# Patient Record
Sex: Female | Born: 1935
Health system: Southern US, Community
[De-identification: ages and names within clinical notes are randomized; demographics above are authoritative.]

## PROBLEM LIST (undated history)

## (undated) DIAGNOSIS — M1990 Unspecified osteoarthritis, unspecified site: Secondary | ICD-10-CM

## (undated) DIAGNOSIS — I10 Essential (primary) hypertension: Secondary | ICD-10-CM

## (undated) DIAGNOSIS — E871 Hypo-osmolality and hyponatremia: Secondary | ICD-10-CM

## (undated) DIAGNOSIS — I447 Left bundle-branch block, unspecified: Secondary | ICD-10-CM

## (undated) DIAGNOSIS — F419 Anxiety disorder, unspecified: Secondary | ICD-10-CM

## (undated) DIAGNOSIS — M810 Age-related osteoporosis without current pathological fracture: Secondary | ICD-10-CM

## (undated) DIAGNOSIS — K219 Gastro-esophageal reflux disease without esophagitis: Secondary | ICD-10-CM

## (undated) DIAGNOSIS — E7800 Pure hypercholesterolemia, unspecified: Secondary | ICD-10-CM

## (undated) DIAGNOSIS — Z01810 Encounter for preprocedural cardiovascular examination: Secondary | ICD-10-CM

## (undated) DIAGNOSIS — I4891 Unspecified atrial fibrillation: Secondary | ICD-10-CM

## (undated) DIAGNOSIS — F329 Major depressive disorder, single episode, unspecified: Secondary | ICD-10-CM

## (undated) DIAGNOSIS — Z9981 Dependence on supplemental oxygen: Secondary | ICD-10-CM

## (undated) DIAGNOSIS — H409 Unspecified glaucoma: Secondary | ICD-10-CM

## (undated) DIAGNOSIS — I119 Hypertensive heart disease without heart failure: Secondary | ICD-10-CM

## (undated) DIAGNOSIS — S72001A Fracture of unspecified part of neck of right femur, initial encounter for closed fracture: Secondary | ICD-10-CM

## (undated) DIAGNOSIS — I509 Heart failure, unspecified: Secondary | ICD-10-CM

## (undated) DIAGNOSIS — D62 Acute posthemorrhagic anemia: Secondary | ICD-10-CM

## (undated) DIAGNOSIS — S72009A Fracture of unspecified part of neck of unspecified femur, initial encounter for closed fracture: Secondary | ICD-10-CM

## (undated) DIAGNOSIS — R509 Fever, unspecified: Secondary | ICD-10-CM

## (undated) HISTORY — DX: Fever, unspecified: R50.9

## (undated) HISTORY — PX: TUMOR REMOVAL: SHX12

## (undated) HISTORY — DX: Hypo-osmolality and hyponatremia: E87.1

## (undated) HISTORY — DX: Hypertensive heart disease without heart failure: I11.9

## (undated) HISTORY — DX: Unspecified atrial fibrillation: I48.91

## (undated) HISTORY — PX: HERNIA REPAIR: SHX51

## (undated) HISTORY — DX: Unspecified osteoarthritis, unspecified site: M19.90

## (undated) HISTORY — DX: Encounter for preprocedural cardiovascular examination: Z01.810

## (undated) HISTORY — DX: Left bundle-branch block, unspecified: I44.7

## (undated) HISTORY — DX: Age-related osteoporosis without current pathological fracture: M81.0

## (undated) HISTORY — DX: Anxiety disorder, unspecified: F41.9

## (undated) HISTORY — DX: Gastro-esophageal reflux disease without esophagitis: K21.9

## (undated) HISTORY — DX: Acute posthemorrhagic anemia: D62

## (undated) HISTORY — DX: Major depressive disorder, single episode, unspecified: F32.9

---

## 2011-12-31 DIAGNOSIS — R0602 Shortness of breath: Secondary | ICD-10-CM | POA: Diagnosis not present

## 2011-12-31 DIAGNOSIS — L570 Actinic keratosis: Secondary | ICD-10-CM | POA: Diagnosis not present

## 2011-12-31 DIAGNOSIS — Z79899 Other long term (current) drug therapy: Secondary | ICD-10-CM | POA: Diagnosis not present

## 2011-12-31 DIAGNOSIS — R5381 Other malaise: Secondary | ICD-10-CM | POA: Diagnosis not present

## 2011-12-31 DIAGNOSIS — I10 Essential (primary) hypertension: Secondary | ICD-10-CM | POA: Diagnosis not present

## 2011-12-31 DIAGNOSIS — E782 Mixed hyperlipidemia: Secondary | ICD-10-CM | POA: Diagnosis not present

## 2012-01-19 DIAGNOSIS — L570 Actinic keratosis: Secondary | ICD-10-CM | POA: Diagnosis not present

## 2012-03-03 DIAGNOSIS — L570 Actinic keratosis: Secondary | ICD-10-CM | POA: Diagnosis not present

## 2012-05-25 DIAGNOSIS — F329 Major depressive disorder, single episode, unspecified: Secondary | ICD-10-CM | POA: Diagnosis not present

## 2012-05-25 DIAGNOSIS — I10 Essential (primary) hypertension: Secondary | ICD-10-CM | POA: Diagnosis not present

## 2012-05-25 DIAGNOSIS — Z79899 Other long term (current) drug therapy: Secondary | ICD-10-CM | POA: Diagnosis not present

## 2012-05-25 DIAGNOSIS — E782 Mixed hyperlipidemia: Secondary | ICD-10-CM | POA: Diagnosis not present

## 2012-06-27 DIAGNOSIS — H2510 Age-related nuclear cataract, unspecified eye: Secondary | ICD-10-CM | POA: Diagnosis not present

## 2012-07-05 DIAGNOSIS — R0989 Other specified symptoms and signs involving the circulatory and respiratory systems: Secondary | ICD-10-CM | POA: Diagnosis not present

## 2012-07-05 DIAGNOSIS — I10 Essential (primary) hypertension: Secondary | ICD-10-CM | POA: Diagnosis not present

## 2012-07-05 DIAGNOSIS — H409 Unspecified glaucoma: Secondary | ICD-10-CM | POA: Diagnosis not present

## 2012-07-05 DIAGNOSIS — E782 Mixed hyperlipidemia: Secondary | ICD-10-CM | POA: Diagnosis not present

## 2012-07-18 DIAGNOSIS — H2510 Age-related nuclear cataract, unspecified eye: Secondary | ICD-10-CM | POA: Diagnosis not present

## 2012-07-18 DIAGNOSIS — H40019 Open angle with borderline findings, low risk, unspecified eye: Secondary | ICD-10-CM | POA: Diagnosis not present

## 2012-08-08 DIAGNOSIS — H2510 Age-related nuclear cataract, unspecified eye: Secondary | ICD-10-CM | POA: Diagnosis not present

## 2012-08-08 DIAGNOSIS — H259 Unspecified age-related cataract: Secondary | ICD-10-CM | POA: Diagnosis not present

## 2012-08-08 DIAGNOSIS — H269 Unspecified cataract: Secondary | ICD-10-CM | POA: Diagnosis not present

## 2012-08-17 DIAGNOSIS — G4700 Insomnia, unspecified: Secondary | ICD-10-CM | POA: Diagnosis not present

## 2012-08-17 DIAGNOSIS — M25559 Pain in unspecified hip: Secondary | ICD-10-CM | POA: Diagnosis not present

## 2012-08-17 DIAGNOSIS — F411 Generalized anxiety disorder: Secondary | ICD-10-CM | POA: Diagnosis not present

## 2012-10-03 DIAGNOSIS — H26499 Other secondary cataract, unspecified eye: Secondary | ICD-10-CM | POA: Diagnosis not present

## 2012-11-13 DIAGNOSIS — H9209 Otalgia, unspecified ear: Secondary | ICD-10-CM | POA: Diagnosis not present

## 2012-11-16 DIAGNOSIS — Z113 Encounter for screening for infections with a predominantly sexual mode of transmission: Secondary | ICD-10-CM | POA: Diagnosis not present

## 2012-11-16 DIAGNOSIS — Z1339 Encounter for screening examination for other mental health and behavioral disorders: Secondary | ICD-10-CM | POA: Diagnosis not present

## 2012-11-16 DIAGNOSIS — Z1331 Encounter for screening for depression: Secondary | ICD-10-CM | POA: Diagnosis not present

## 2012-11-16 DIAGNOSIS — R5381 Other malaise: Secondary | ICD-10-CM | POA: Diagnosis not present

## 2012-11-16 DIAGNOSIS — E782 Mixed hyperlipidemia: Secondary | ICD-10-CM | POA: Diagnosis not present

## 2012-11-16 DIAGNOSIS — I10 Essential (primary) hypertension: Secondary | ICD-10-CM | POA: Diagnosis not present

## 2012-11-16 DIAGNOSIS — Z79899 Other long term (current) drug therapy: Secondary | ICD-10-CM | POA: Diagnosis not present

## 2012-11-16 DIAGNOSIS — Z0000 Encounter for general adult medical examination without abnormal findings: Secondary | ICD-10-CM | POA: Diagnosis not present

## 2013-02-16 DIAGNOSIS — Z79899 Other long term (current) drug therapy: Secondary | ICD-10-CM | POA: Diagnosis not present

## 2013-02-16 DIAGNOSIS — J309 Allergic rhinitis, unspecified: Secondary | ICD-10-CM | POA: Diagnosis not present

## 2013-02-16 DIAGNOSIS — E782 Mixed hyperlipidemia: Secondary | ICD-10-CM | POA: Diagnosis not present

## 2013-02-16 DIAGNOSIS — H905 Unspecified sensorineural hearing loss: Secondary | ICD-10-CM | POA: Diagnosis not present

## 2013-02-16 DIAGNOSIS — I10 Essential (primary) hypertension: Secondary | ICD-10-CM | POA: Diagnosis not present

## 2013-04-02 DIAGNOSIS — H02059 Trichiasis without entropian unspecified eye, unspecified eyelid: Secondary | ICD-10-CM | POA: Diagnosis not present

## 2013-04-02 DIAGNOSIS — H4011X0 Primary open-angle glaucoma, stage unspecified: Secondary | ICD-10-CM | POA: Diagnosis not present

## 2013-04-11 DIAGNOSIS — S72033A Displaced midcervical fracture of unspecified femur, initial encounter for closed fracture: Secondary | ICD-10-CM | POA: Diagnosis not present

## 2013-04-11 DIAGNOSIS — I10 Essential (primary) hypertension: Secondary | ICD-10-CM | POA: Diagnosis not present

## 2013-04-11 DIAGNOSIS — E7800 Pure hypercholesterolemia, unspecified: Secondary | ICD-10-CM | POA: Diagnosis not present

## 2013-04-11 DIAGNOSIS — W010XXA Fall on same level from slipping, tripping and stumbling without subsequent striking against object, initial encounter: Secondary | ICD-10-CM | POA: Diagnosis not present

## 2013-04-11 DIAGNOSIS — Z01818 Encounter for other preprocedural examination: Secondary | ICD-10-CM | POA: Diagnosis not present

## 2013-04-11 DIAGNOSIS — I498 Other specified cardiac arrhythmias: Secondary | ICD-10-CM | POA: Diagnosis not present

## 2013-04-11 DIAGNOSIS — R0989 Other specified symptoms and signs involving the circulatory and respiratory systems: Secondary | ICD-10-CM | POA: Diagnosis not present

## 2013-04-11 DIAGNOSIS — S72009A Fracture of unspecified part of neck of unspecified femur, initial encounter for closed fracture: Secondary | ICD-10-CM | POA: Diagnosis not present

## 2013-04-12 ENCOUNTER — Inpatient Hospital Stay (HOSPITAL_COMMUNITY): Payer: Medicare Other

## 2013-04-12 ENCOUNTER — Inpatient Hospital Stay (HOSPITAL_COMMUNITY)
Admission: EM | Admit: 2013-04-12 | Discharge: 2013-04-17 | DRG: 470 | Disposition: A | Discharge status: Skilled Nursing Facility | Payer: Medicare Other | Attending: Internal Medicine | Admitting: Internal Medicine

## 2013-04-12 ENCOUNTER — Encounter (HOSPITAL_COMMUNITY)
Admission: EM | Disposition: A | Discharge status: Skilled Nursing Facility | Payer: Self-pay | Source: Home / Self Care | Attending: Internal Medicine

## 2013-04-12 ENCOUNTER — Emergency Department (HOSPITAL_COMMUNITY): Payer: Medicare Other

## 2013-04-12 ENCOUNTER — Encounter (HOSPITAL_COMMUNITY): Payer: Self-pay | Admitting: Emergency Medicine

## 2013-04-12 DIAGNOSIS — F329 Major depressive disorder, single episode, unspecified: Secondary | ICD-10-CM | POA: Diagnosis present

## 2013-04-12 DIAGNOSIS — Z471 Aftercare following joint replacement surgery: Secondary | ICD-10-CM | POA: Diagnosis not present

## 2013-04-12 DIAGNOSIS — Z881 Allergy status to other antibiotic agents status: Secondary | ICD-10-CM

## 2013-04-12 DIAGNOSIS — E785 Hyperlipidemia, unspecified: Secondary | ICD-10-CM | POA: Diagnosis not present

## 2013-04-12 DIAGNOSIS — I498 Other specified cardiac arrhythmias: Secondary | ICD-10-CM | POA: Diagnosis not present

## 2013-04-12 DIAGNOSIS — Z882 Allergy status to sulfonamides status: Secondary | ICD-10-CM

## 2013-04-12 DIAGNOSIS — Z01818 Encounter for other preprocedural examination: Secondary | ICD-10-CM | POA: Diagnosis not present

## 2013-04-12 DIAGNOSIS — S72009A Fracture of unspecified part of neck of unspecified femur, initial encounter for closed fracture: Secondary | ICD-10-CM

## 2013-04-12 DIAGNOSIS — Z96649 Presence of unspecified artificial hip joint: Secondary | ICD-10-CM | POA: Diagnosis not present

## 2013-04-12 DIAGNOSIS — K219 Gastro-esophageal reflux disease without esophagitis: Secondary | ICD-10-CM | POA: Diagnosis not present

## 2013-04-12 DIAGNOSIS — Z602 Problems related to living alone: Secondary | ICD-10-CM

## 2013-04-12 DIAGNOSIS — Z7901 Long term (current) use of anticoagulants: Secondary | ICD-10-CM

## 2013-04-12 DIAGNOSIS — R0989 Other specified symptoms and signs involving the circulatory and respiratory systems: Secondary | ICD-10-CM | POA: Diagnosis not present

## 2013-04-12 DIAGNOSIS — I119 Hypertensive heart disease without heart failure: Secondary | ICD-10-CM | POA: Diagnosis not present

## 2013-04-12 DIAGNOSIS — Z66 Do not resuscitate: Secondary | ICD-10-CM | POA: Diagnosis present

## 2013-04-12 DIAGNOSIS — H409 Unspecified glaucoma: Secondary | ICD-10-CM | POA: Diagnosis present

## 2013-04-12 DIAGNOSIS — S73006A Unspecified dislocation of unspecified hip, initial encounter: Secondary | ICD-10-CM | POA: Diagnosis not present

## 2013-04-12 DIAGNOSIS — Z7982 Long term (current) use of aspirin: Secondary | ICD-10-CM | POA: Diagnosis not present

## 2013-04-12 DIAGNOSIS — F411 Generalized anxiety disorder: Secondary | ICD-10-CM | POA: Diagnosis present

## 2013-04-12 DIAGNOSIS — I10 Essential (primary) hypertension: Secondary | ICD-10-CM | POA: Diagnosis present

## 2013-04-12 DIAGNOSIS — D62 Acute posthemorrhagic anemia: Secondary | ICD-10-CM | POA: Diagnosis present

## 2013-04-12 DIAGNOSIS — E871 Hypo-osmolality and hyponatremia: Secondary | ICD-10-CM | POA: Diagnosis not present

## 2013-04-12 DIAGNOSIS — W010XXA Fall on same level from slipping, tripping and stumbling without subsequent striking against object, initial encounter: Secondary | ICD-10-CM | POA: Diagnosis present

## 2013-04-12 DIAGNOSIS — F3289 Other specified depressive episodes: Secondary | ICD-10-CM | POA: Diagnosis present

## 2013-04-12 DIAGNOSIS — S72009D Fracture of unspecified part of neck of unspecified femur, subsequent encounter for closed fracture with routine healing: Secondary | ICD-10-CM | POA: Diagnosis not present

## 2013-04-12 DIAGNOSIS — R509 Fever, unspecified: Secondary | ICD-10-CM | POA: Diagnosis present

## 2013-04-12 DIAGNOSIS — Z01810 Encounter for preprocedural cardiovascular examination: Secondary | ICD-10-CM

## 2013-04-12 DIAGNOSIS — S7290XA Unspecified fracture of unspecified femur, initial encounter for closed fracture: Secondary | ICD-10-CM | POA: Diagnosis not present

## 2013-04-12 DIAGNOSIS — E7800 Pure hypercholesterolemia, unspecified: Secondary | ICD-10-CM

## 2013-04-12 DIAGNOSIS — Z79899 Other long term (current) drug therapy: Secondary | ICD-10-CM

## 2013-04-12 DIAGNOSIS — S72001A Fracture of unspecified part of neck of right femur, initial encounter for closed fracture: Secondary | ICD-10-CM

## 2013-04-12 DIAGNOSIS — Y92009 Unspecified place in unspecified non-institutional (private) residence as the place of occurrence of the external cause: Secondary | ICD-10-CM

## 2013-04-12 DIAGNOSIS — M25559 Pain in unspecified hip: Secondary | ICD-10-CM | POA: Diagnosis not present

## 2013-04-12 DIAGNOSIS — S72033A Displaced midcervical fracture of unspecified femur, initial encounter for closed fracture: Principal | ICD-10-CM | POA: Diagnosis present

## 2013-04-12 DIAGNOSIS — D500 Iron deficiency anemia secondary to blood loss (chronic): Secondary | ICD-10-CM | POA: Diagnosis not present

## 2013-04-12 DIAGNOSIS — M810 Age-related osteoporosis without current pathological fracture: Secondary | ICD-10-CM | POA: Diagnosis present

## 2013-04-12 DIAGNOSIS — Z87891 Personal history of nicotine dependence: Secondary | ICD-10-CM | POA: Diagnosis not present

## 2013-04-12 DIAGNOSIS — J9819 Other pulmonary collapse: Secondary | ICD-10-CM | POA: Diagnosis not present

## 2013-04-12 DIAGNOSIS — J90 Pleural effusion, not elsewhere classified: Secondary | ICD-10-CM | POA: Diagnosis not present

## 2013-04-12 HISTORY — DX: Encounter for preprocedural cardiovascular examination: Z01.810

## 2013-04-12 HISTORY — DX: Pure hypercholesterolemia, unspecified: E78.00

## 2013-04-12 HISTORY — DX: Fracture of unspecified part of neck of right femur, initial encounter for closed fracture: S72.001A

## 2013-04-12 HISTORY — DX: Unspecified glaucoma: H40.9

## 2013-04-12 HISTORY — DX: Gastro-esophageal reflux disease without esophagitis: K21.9

## 2013-04-12 HISTORY — DX: Fracture of unspecified part of neck of unspecified femur, initial encounter for closed fracture: S72.009A

## 2013-04-12 HISTORY — DX: Age-related osteoporosis without current pathological fracture: M81.0

## 2013-04-12 HISTORY — DX: Essential (primary) hypertension: I10

## 2013-04-12 LAB — URINALYSIS, ROUTINE W REFLEX MICROSCOPIC
Leukocytes, UA: NEGATIVE
Nitrite: NEGATIVE
Protein, ur: NEGATIVE mg/dL
Urobilinogen, UA: 1 mg/dL (ref 0.0–1.0)

## 2013-04-12 LAB — CBC
MCHC: 35.1 g/dL (ref 30.0–36.0)
RDW: 13.1 % (ref 11.5–15.5)

## 2013-04-12 LAB — COMPREHENSIVE METABOLIC PANEL
ALT: 26 U/L (ref 0–35)
AST: 33 U/L (ref 0–37)
Calcium: 10.4 mg/dL (ref 8.4–10.5)
GFR calc Af Amer: >90 mL/min (ref >90)
Sodium: 138 mEq/L (ref 135–145)
Total Protein: 7.9 g/dL (ref 6.0–8.3)

## 2013-04-12 LAB — APTT: aPTT: 29 seconds (ref 24–37)

## 2013-04-12 SURGERY — HEMIARTHROPLASTY, HIP, DIRECT ANTERIOR APPROACH, FOR FRACTURE
Anesthesia: General | Laterality: Right

## 2013-04-12 MED ORDER — FENTANYL CITRATE 0.05 MG/ML IJ SOLN
50.0000 ug | Freq: Once | INTRAMUSCULAR | Status: AC
  Administered 2013-04-12: 50 ug via INTRAVENOUS
  Filled 2013-04-12: qty 2

## 2013-04-12 MED ORDER — SODIUM CHLORIDE 0.9 % IV BOLUS (SEPSIS)
1000.0000 mL | Freq: Once | INTRAVENOUS | Status: AC
  Administered 2013-04-12: 1000 mL via INTRAVENOUS

## 2013-04-12 MED ORDER — METOPROLOL TARTRATE 1 MG/ML IV SOLN: 5.0000 mg | INTRAVENOUS | Status: DC | PRN

## 2013-04-12 MED ORDER — MORPHINE SULFATE 2 MG/ML IJ SOLN: 0.5000 mg | INTRAMUSCULAR | Status: DC | PRN

## 2013-04-12 MED ORDER — HYDROCODONE-ACETAMINOPHEN 5-325 MG PO TABS
1.0000 | ORAL_TABLET | Freq: Four times a day (QID) | ORAL | Status: DC | PRN
  Administered 2013-04-12 – 2013-04-13 (×4): 1 via ORAL
  Filled 2013-04-12 (×3): qty 1
  Filled 2013-04-12: qty 2

## 2013-04-12 MED ORDER — SORBITOL 70 % SOLN
30.0000 mL | Freq: Every day | Status: DC | PRN
  Filled 2013-04-12: qty 30

## 2013-04-12 MED ORDER — SERTRALINE HCL 25 MG PO TABS
25.0000 mg | ORAL_TABLET | Freq: Every day | ORAL | Status: DC
  Administered 2013-04-12 – 2013-04-17 (×6): 25 mg via ORAL
  Filled 2013-04-12 (×6): qty 1

## 2013-04-12 MED ORDER — AMLODIPINE BESYLATE 5 MG PO TABS
5.0000 mg | ORAL_TABLET | Freq: Every day | ORAL | Status: DC
  Administered 2013-04-12 – 2013-04-17 (×6): 5 mg via ORAL
  Filled 2013-04-12 (×6): qty 1

## 2013-04-12 MED ORDER — HYDROCHLOROTHIAZIDE 25 MG PO TABS
25.0000 mg | ORAL_TABLET | Freq: Every day | ORAL | Status: DC
  Administered 2013-04-12 – 2013-04-17 (×6): 25 mg via ORAL
  Filled 2013-04-12 (×6): qty 1

## 2013-04-12 MED ORDER — FAMOTIDINE 10 MG PO TABS
10.0000 mg | ORAL_TABLET | Freq: Every day | ORAL | Status: DC
  Administered 2013-04-12 – 2013-04-16 (×5): 10 mg via ORAL
  Filled 2013-04-12 (×7): qty 1

## 2013-04-12 MED ORDER — LORAZEPAM 0.5 MG PO TABS
0.5000 mg | ORAL_TABLET | Freq: Two times a day (BID) | ORAL | Status: DC
  Administered 2013-04-12 – 2013-04-16 (×10): 0.5 mg via ORAL
  Filled 2013-04-12 (×10): qty 1

## 2013-04-12 MED ORDER — SIMVASTATIN 20 MG PO TABS
20.0000 mg | ORAL_TABLET | Freq: Every evening | ORAL | Status: DC
  Administered 2013-04-12 – 2013-04-16 (×3): 20 mg via ORAL
  Filled 2013-04-12 (×6): qty 1

## 2013-04-12 MED ORDER — CEFAZOLIN SODIUM-DEXTROSE 2-3 GM-% IV SOLR
2.0000 g | INTRAVENOUS | Status: DC
  Filled 2013-04-12: qty 50

## 2013-04-12 MED ORDER — ASPIRIN EC 81 MG PO TBEC
81.0000 mg | DELAYED_RELEASE_TABLET | Freq: Every day | ORAL | Status: DC
Start: 2013-04-12 — End: 2013-04-15
  Administered 2013-04-12 – 2013-04-15 (×4): 81 mg via ORAL
  Filled 2013-04-12 (×4): qty 1

## 2013-04-12 MED ORDER — SENNA 8.6 MG PO TABS
1.0000 | ORAL_TABLET | Freq: Two times a day (BID) | ORAL | Status: DC
  Administered 2013-04-12 – 2013-04-17 (×10): 8.6 mg via ORAL
  Filled 2013-04-12 (×13): qty 1

## 2013-04-12 MED ORDER — FENTANYL CITRATE 0.05 MG/ML IJ SOLN: 50.0000 ug | INTRAMUSCULAR | Status: DC | PRN

## 2013-04-12 MED ORDER — LOSARTAN POTASSIUM 50 MG PO TABS
50.0000 mg | ORAL_TABLET | Freq: Every day | ORAL | Status: DC
  Administered 2013-04-12 – 2013-04-17 (×6): 50 mg via ORAL
  Filled 2013-04-12 (×6): qty 1

## 2013-04-12 MED ORDER — DEXTROSE-NACL 5-0.45 % IV SOLN
INTRAVENOUS | Status: DC
  Administered 2013-04-12: 08:00:00 via INTRAVENOUS

## 2013-04-12 MED ORDER — HYDRALAZINE HCL 20 MG/ML IJ SOLN: 10.0000 mg | Freq: Four times a day (QID) | INTRAMUSCULAR | Status: DC | PRN

## 2013-04-12 MED ORDER — KCL IN DEXTROSE-NACL 20-5-0.45 MEQ/L-%-% IV SOLN
Freq: Once | INTRAVENOUS | Status: DC
  Filled 2013-04-12: qty 1000

## 2013-04-12 MED ORDER — ONDANSETRON HCL 4 MG/2ML IJ SOLN
4.0000 mg | Freq: Once | INTRAMUSCULAR | Status: AC
  Administered 2013-04-12: 4 mg via INTRAVENOUS
  Filled 2013-04-12: qty 2

## 2013-04-12 NOTE — ED Notes (Signed)
Patient transported to X-ray 

## 2013-04-12 NOTE — ED Notes (Signed)
Pt transferred from [facility] with a right fractured hip. Pt states she requested to come here due to she did not want to be treated at [facility]. Pt states she was trying to get out the car with her daughter and slipped and fell on right side. Pt states she did not loss consiousness. Pt does have bruising to the right hip, leg and right arm. Daughter at bedside was there when patient fell.  Pt states pain 4/10 without movement.  Pt arrived with no IV in place. Alert and oriented.

## 2013-04-12 NOTE — ED Notes (Signed)
Family at bedside. 

## 2013-04-12 NOTE — ED Provider Notes (Signed)
History     CSN: [id number]  Arrival date & time 04/12/13  4098   First MD Initiated Contact with Patient 04/12/13 [phone]      Chief Complaint  Patient presents with  . Right hip fracture     (Consider location/radiation/quality/duration/timing/severity/associated sxs/prior treatment) HPI Mrs. Caton is a very pleasant and generally independent 79 year old woman with well-controlled hypercholesterolemia and hypertension. She tripped while getting out of her daughter's car, landed against her right hip and sustained a right subcapital hip fracture.  The patient was initially seen at [facility] Emergency Department. At the daughter's request, the patient was transferred to this hospital. It is unclear if there was an accepting physician.  The patient says she is having aching, right-sided hip pain which is severe and nonradiating. It is worse with any attempt to move the right leg or the torso. The patient denies any paresthesias or sense of motor weakness. She denies head trauma or injury to any other region.  The patient is not anticoagulated. She has not have a current relationship with an orthopedic surgeon.  Last meal was approximately 12 hours ago.  Past Medical History  Diagnosis Date  . Hypertension   . Hypercholesterolemia     History reviewed. No pertinent past surgical history.  History reviewed. No pertinent family history.  History  Substance Use Topics  . Smoking status: Never Smoker   . Smokeless tobacco: Not on file  . Alcohol Use: No    OB History   Grav Para Term Preterm Abortions TAB SAB Ect Mult Living                  Review of Systems  Gen: no weight loss, fevers, chills, night sweats Eyes: no discharge or drainage, no occular pain or visual changes Nose: no epistaxis or rhinorrhea Mouth: no dental pain, no sore throat Neck: no neck pain Lungs: no SOB, cough, wheezing CV: no chest pain, palpitations, dependent edema or orthopnea Abd:  no abdominal pain, nausea, vomiting GU: no dysuria or gross hematuria MSK: As per history of present illness, otherwise negative Neuro: no headache, no focal neurologic deficits Skin: no rash Psyche: negative.  Allergies  Bactrim and Sulfa antibiotics  Home Medications   Current Outpatient Rx  Name  Route  Sig  Dispense  Refill  . amLODipine (NORVASC) 5 MG tablet   Oral   Take 5 mg by mouth daily.         Marland Kitchen aspirin EC 81 MG tablet   Oral   Take 81 mg by mouth daily.         . hydrochlorothiazide (HYDRODIURIL) 25 MG tablet   Oral   Take 25 mg by mouth daily.         Marland Kitchen losartan (COZAAR) 50 MG tablet   Oral   Take 50 mg by mouth daily.         Marland Kitchen oxazepam (SERAX) 15 MG capsule   Oral   Take 15 mg by mouth at bedtime as needed for sleep or anxiety.         Marland Kitchen PRESCRIPTION MEDICATION   Oral   Take 1 tablet by mouth 3 (three) times daily as needed. Muscle relaxer.         . ranitidine (ZANTAC) 300 MG tablet   Oral   Take 300 mg by mouth at bedtime.         . sertraline (ZOLOFT) 25 MG tablet   Oral   Take 25 mg by mouth  daily.         . simvastatin (ZOCOR) 20 MG tablet   Oral   Take 20 mg by mouth every evening.           BP 156/62  Pulse 117  Temp(Src) 98.8 F (37.1 C) (Oral)  Resp 20  Ht 4\' 8"  (1.422 m)  Wt 102 lb (46.267 kg)  BMI 22.88 kg/m2  SpO2 93%  Physical Exam  Gen: well developed and well nourished appearing Head: NCAT, no signs of trauma. Eyes: PERL, EOMI Nose: no epistaixis or rhinorrhea Mouth/throat: mucosa is moist and pink Neck: supple, no stridor, no ttp Lungs: CTA B, no wheezing, rhonchi or rales CV: rapid and regular Abd: soft, notender, nondistended Back: no ttp, no cva ttp Skin: no rashese, wnl Ext: shortening and external rotation of the right hip, palp and symmetric DP bilaterally, pt can wiggle toes, sensation intact to light touch both feet.  Neuro: CN ii-xii grossly intact, no focal deficits Psyche; mildly  anxious affect,  calm and cooperative.  ED Course  Procedures (including critical care time)  Results for orders placed during the hospital encounter of 04/12/13 (from the past 24 hour(s))  CBC     Status: Abnormal   Collection Time    04/12/13  3:45 AM      Result Value Range   WBC 12.9 (*) 4.0 - 10.5 K/uL   RBC 4.62  3.87 - 5.11 MIL/uL   Hemoglobin 14.2  12.0 - 15.0 g/dL   HCT 09.8  11.9 - 14.7 %   MCV 87.4  78.0 - 100.0 fL   MCH 30.7  26.0 - 34.0 pg   MCHC 35.1  30.0 - 36.0 g/dL   RDW 82.9  56.2 - 13.0 %   Platelets 149 (*) 150 - 400 K/uL  COMPREHENSIVE METABOLIC PANEL     Status: Abnormal   Collection Time    04/12/13  3:45 AM      Result Value Range   Sodium 138  135 - 145 mEq/L   Potassium 3.5  3.5 - 5.1 mEq/L   Chloride 98  96 - 112 mEq/L   CO2 25  19 - 32 mEq/L   Glucose, Bld 130 (*) 70 - 99 mg/dL   BUN 16  6 - 23 mg/dL   Creatinine, Ser 8.65  0.50 - 1.10 mg/dL   Calcium 78.4  8.4 - 69.6 mg/dL   Total Protein 7.9  6.0 - 8.3 g/dL   Albumin 4.5  3.5 - 5.2 g/dL   AST 33  0 - 37 U/L   ALT 26  0 - 35 U/L   Alkaline Phosphatase 89  39 - 117 U/L   Total Bilirubin 0.6  0.3 - 1.2 mg/dL   GFR calc non Af Amer 81 (*) >90 mL/min   GFR calc Af Amer >90  >90 mL/min  PROTIME-INR     Status: None   Collection Time    04/12/13  3:45 AM      Result Value Range   Prothrombin Time 13.5  11.6 - 15.2 seconds   INR 1.04  0.00 - 1.49  APTT     Status: None   Collection Time    04/12/13  3:45 AM      Result Value Range   aPTT 29  24 - 37 seconds  TYPE AND SCREEN     Status: None   Collection Time    04/12/13  4:40 AM      Result Value  Range   ABO/RH(D) B POS     Antibody Screen NEG     Sample Expiration 04/15/2013    ABO/RH     Status: None   Collection Time    04/12/13  4:40 AM      Result Value Range   ABO/RH(D) B POS    URINALYSIS, ROUTINE W REFLEX MICROSCOPIC     Status: Abnormal   Collection Time    04/12/13  5:11 AM      Result Value Range   Color, Urine YELLOW   YELLOW   APPearance CLEAR  CLEAR   Specific Gravity, Urine 1.012  1.005 - 1.030   pH 7.5  5.0 - 8.0   Glucose, UA NEGATIVE  NEGATIVE mg/dL   Hgb urine dipstick NEGATIVE  NEGATIVE   Bilirubin Urine NEGATIVE  NEGATIVE   Ketones, ur 40 (*) NEGATIVE mg/dL   Protein, ur NEGATIVE  NEGATIVE mg/dL   Urobilinogen, UA 1.0  0.0 - 1.0 mg/dL   Nitrite NEGATIVE  NEGATIVE   Leukocytes, UA NEGATIVE  NEGATIVE   EKG: sinus tach, no acute ischemic changes, normal intervals, normal axis, normal qrs complex  Dg Chest 1 View  04/12/2013   *RADIOLOGY REPORT*  Clinical Data: Preoperative chest radiograph for right hip fracture.  CHEST - 1 VIEW  Comparison: None.  Findings: The lungs are mildly hyperexpanded, with flattening of the hemidiaphragms, likely reflecting COPD.  Vascular congestion may be transient in nature.  No definite focal consolidation, pleural effusion or pneumothorax is seen.  The cardiomediastinal silhouette is normal in size; calcification is noted in the aortic arch.  No acute osseous abnormalities are seen.  IMPRESSION: Suspect COPD; vascular congestion may be transient in nature. Lungs otherwise clear.   Original Report Authenticated By: Tonia Ghent, M.D.    MDM  Right subcapital hip fx. Patient has been NPO without maintenance fluids for > 12 hr. We will resuscitate with crystalloid and initiate maintenance fluids with D5 NS 1/2 NS. Fentanyl for pain. Case discussed with Dr. Lajoyce Corners who will consult on the patient. He has asked that the hospitalist service admit. Case discussed with hospitalist who will see and admit.         Brandt Loosen, MD 04/12/13 [phone]

## 2013-04-12 NOTE — Consult Note (Signed)
CARDIOLOGY CONSULT NOTE   Patient ID: Rita Chapman MRN: [id number] DOB/AGE: June 21, 1926 79 y.o.  Admit date: 04/12/2013  Primary Physician   Doreen Salvage, MD Primary Cardiologist   New Reason for Consultation   Pauses  Rita Chapman is a 79 y.o. female with no history of CAD. She fell yesterday and was initially transported Rita Chapman in the hospital. However, when she was found to have a right hip fracture, she was transferred to [facility] for further evaluation and treatment. Cardiology was consulted to evaluate Rita Chapman for pauses.  Rita Chapman never passed out. She reports regular episodes of dizziness. Some of them are orthostatic in nature. Some occur when she Chapman been standing. She may have had one last night prior to Rita Chapman fall but the history is unclear. She always felt these were secondary to sinus problems but Rita Chapman daughter's not aware of any recent sinus problems. She is active around the house and does Rita Chapman own housework including mopping and vacuuming without any difficulty. She Chapman never had chest pain and Chapman no history of dyspnea on exertion. She occasionally feels Rita Chapman heart skip or pause. This is associated with dizziness.   Past Medical History  Diagnosis Date  . Hypertension   . Hypercholesterolemia      Past Surgical History  Procedure Laterality Date  . Hernia repair    . Tumor removal      Benign    Allergies  Allergen Reactions  . Bactrim (Sulfamethoxazole W-Trimethoprim) Other (See Comments)    Unknown   . Sulfa Antibiotics Other (See Comments)    Unknown    I have reviewed the patient's current medications . amLODipine  5 mg Oral Daily  . aspirin EC  81 mg Oral Daily  . famotidine  10 mg Oral QHS  . hydrochlorothiazide  25 mg Oral Daily  . LORazepam  0.5 mg Oral BID  . losartan  50 mg Oral Daily  . senna  1 tablet Oral BID  . sertraline  25 mg Oral Daily  . simvastatin  20 mg Oral QPM     fentaNYL, hydrALAZINE, HYDROcodone-acetaminophen, morphine  injection, sorbitol  Medication Sig  amLODipine (NORVASC) 5 MG tablet Take 5 mg by mouth daily.  aspirin EC 81 MG tablet Take 81 mg by mouth daily.  hydrochlorothiazide (HYDRODIURIL) 25 MG tablet Take 25 mg by mouth daily.  losartan (COZAAR) 50 MG tablet Take 50 mg by mouth daily.  oxazepam (SERAX) 15 MG capsule Take 15 mg by mouth at bedtime as needed for sleep or anxiety.  PRESCRIPTION MEDICATION Take 1 tablet by mouth 3 times daily as needed. Muscle relaxer.  ranitidine (ZANTAC) 300 MG tablet Take 300 mg by mouth at bedtime.  sertraline (ZOLOFT) 25 MG tablet Take 25 mg by mouth daily.  simvastatin (ZOCOR) 20 MG tablet Take 20 mg by mouth every evening.     History   Social History  . Marital Status: Widowed    Spouse Name: N/A    Number of Children: N/A  . Years of Education: N/A   Occupational History  . Retired    Social History Main Topics  . Smoking status: Former Smoker -- 1.00 packs/day for 30 years  . Smokeless tobacco: Not on file  . Alcohol Use: No  . Drug Use: No  . Sexually Active: No   Other Topics Concern  . Not on file   Social History Narrative   Lives in an apartment alone. Family nearby and helps with care.  Family Status  Relation Status Death Age  . Mother Deceased   . Father Deceased   . Brother Alive     Got a PPM in his 58s  . Sister Deceased     Suddenly, family was told it was a heart attack but she had no history and no autopsy was performed.     ROS: Occasionally Rita Chapman stomach bothers Rita Chapman. She denies melena or any problems with bleeding. She Chapman no recent illnesses, fevers or chills. Full 14 point review of systems complete and found to be negative unless listed above.  Physical Exam: Blood pressure 143/41, pulse 87, temperature 97.5 F (36.4 C), temperature source Oral, resp. rate 16, height 4\' 8"  (1.422 m), weight 102 lb (46.267 kg), SpO2 99.00%.  General: Well developed, well nourished, elderly female in no acute distress Head:  Eyes PERRLA, No xanthomas.   Normocephalic and atraumatic, oropharynx without edema or exudate. Dentition: good Lungs: rales bilateral bases Heart: HRRR S1 S2, no rub/gallop,no murmur. pulses are 2+ all 4extrem.   Neck: soft right carotid bruit. No lymphadenopathy.  JVDnot elevated. Abdomen: Bowel sounds present, abdomen soft and non-tender without masses or hernias noted. Msk:  No spine or cva tenderness. No weakness, no joint effusions. Rita Chapman right lower extremity his hourly rotated in longer than the left, it was not manipulated Extremities: No clubbing or cyanosis. no edema.  Neuro: Alert and oriented X 3. No focal deficits noted. Psych:  Good affect, responds appropriately Skin: No rashes or lesions noted.  Labs:   Lab Results  Component Value Date   WBC 12.9* 04/12/2013   HGB 14.2 04/12/2013   HCT 40.4 04/12/2013   MCV 87.4 04/12/2013   PLT 149* 04/12/2013    Recent Labs  04/12/13 0345  INR 1.04     Recent Labs Lab 04/12/13 0345  NA 138  K 3.5  CL 98  CO2 25  BUN 16  CREATININE 0.60  CALCIUM 10.4  PROT 7.9  BILITOT 0.6  ALKPHOS 89  ALT 26  AST 33  GLUCOSE 130*   ECG: 12-Apr-2013 07:21:23 SINUS TACHYCARDIA ~ V-rate> 99 NONSPECIFIC INTRAVENTRICULAR CONDUCTION DELAY ~ QRSd >148mS, not LBBB/RBBB BORDERLINE LOW VOLTAGE IN FRONTAL LEADS ~ all frontal leads <0.49mV BORDERLINE R WAVE PROGRESSION, ANTERIOR LEADS ~ R < 0.88mV Vent. rate 101 BPM PR interval 156 ms QRS duration 116 ms QT/QTc 344/446 ms P-R-T axes 0 -4 55   Radiology:  Dg Chest 1 View 04/12/2013   *RADIOLOGY REPORT*  Clinical Data: Preoperative chest radiograph for right hip fracture.  CHEST - 1 VIEW  Comparison: None.  Findings: The lungs are mildly hyperexpanded, with flattening of the hemidiaphragms, likely reflecting COPD.  Vascular congestion may be transient in nature.  No definite focal consolidation, pleural effusion or pneumothorax is seen.  The cardiomediastinal silhouette is normal in size;  calcification is noted in the aortic arch.  No acute osseous abnormalities are seen.  IMPRESSION: Suspect COPD; vascular congestion may be transient in nature. Lungs otherwise clear.   Original Report Authenticated By: Tonia Ghent, M.D.   Dg Hip Portable 1 View Right 04/12/2013   *RADIOLOGY REPORT*  Clinical Data: Hip pain.  Evaluate for possible hip fracture.  PORTABLE RIGHT HIP - 1 VIEW  Comparison: Right femur radiograph 04/12/2013.  Findings: Single view of the right hip redemonstrates the presence of a displaced subcapital fracture of the right femoral neck.  This was better demonstrated on the recent study obtained at [facility] on 04/12/2013.  Mild medial  displacement of the distal femoral fragment is noted.  On this single-view examination, the femoral head appears located within the right acetabulum (it was located on the prior study).  IMPRESSION: 1.  Mildly displaced subcapital right femoral neck fracture redemonstrated, as above.   Original Report Authenticated By: Trudie Reed, M.D.    ASSESSMENT AND PLAN:   The patient was seen today by Dr [place], the patient evaluated and the data reviewed.  79 year old female with no previous cardiac issues was admitted 5/29 from [facility] with a hip fracture. She is for surgery 5/30. Cardiology was consult for pauses.  Pauses: Data from telemetry, the ECG here and records from [place]. No significant pauses were seen. Telemetry is incomplete but shows SR. Will continue to follow. She is able to perform ADL's including vacuuming and mopping without SOB or chest pain.  No further workup prior to surgery is needed.   Principal Problem:   Hip fracture Active Problems:   Hypertension   Hypercholesterolemia   Osteoporosis   Signed: Theodore Demark, PA-C 04/12/2013 3:53 PM Beeper [phone]  Co-Sign MD Patient seen and examined and history reviewed. Agree with above findings and plan. Rita Chapman with history of HTN and HL  presents with right hip fracture following a fall. Patient lost Rita Chapman balance stepping up on a curb. She did not black out. No known cardiac disease. She saw a cardiologist in [facility] in the past and was told Rita Chapman heart was in excellent shape. On exam she is in no distress. No JVD. Lungs are clear. CV without murmur or gallop. Ecg shows NSR with mild nonspecific IVCD. No arrhythmias on monitor. Patient is cleared from cardiac standpoint to proceed with hip surgery. No active cardiac problems such as angina or CHF. Rhythm is normal. Need to continue home BP meds. No further cardiac work up needed.   Theron Arista [facility] 04/12/2013 5:49 PM

## 2013-04-12 NOTE — Consult Note (Signed)
ORTHOPAEDIC CONSULTATION  REQUESTING PHYSICIAN: Leroy Sea, MD  Chief Complaint: Right hip pain  HPI: Rita Chapman is a 79 y.o. female who complains of  right hip pain and inability to walk after a mechanical fall that happened approximately yesterday. She was seen at [facility], and requested management by Dr. Sherlean Foot, and initially, Dr. Eulah Pont (on group call for Dr. Sherlean Foot) had deferred management to Dr. Charlann Boxer, however the family requested management from a partner of Dr. Sherlean Foot, and Dr. Sherlean Foot called me for assistance, as I was on group call today. Therefore, I will be assuming care for Rita Chapman.  She complains of moderate to severe pain directly over the right hip. She also injured her right elbow, but says that this pain has subsided. She previously was able to walk, and lives alone, and an apartment with family nearby.  She denies any loss of consciousness in the fall, and denies any other significant injuries.  Past Medical History  Diagnosis Date  . Hypertension   . Hypercholesterolemia   . Glaucoma   . Fracture of hip 04/12/2013  . GERD (gastroesophageal reflux disease)    Past Surgical History  Procedure Laterality Date  . Hernia repair    . Tumor removal      Benign   History   Social History  . Marital Status: Widowed    Spouse Name: N/A    Number of Children: N/A  . Years of Education: N/A   Occupational History  . Retired    Social History Main Topics  . Smoking status: Former Smoker -- 1.00 packs/day for 30 years  . Smokeless tobacco: Never Used  . Alcohol Use: No  . Drug Use: No  . Sexually Active: No   Other Topics Concern  . None   Social History Narrative   Lives in an apartment alone. Family nearby and helps with care.   History reviewed. No pertinent family history. Allergies  Allergen Reactions  . Bactrim (Sulfamethoxazole W-Trimethoprim) Other (See Comments)    Unknown   . Sulfa Antibiotics Other (See Comments)    Unknown     Prior to Admission medications   Medication Sig Start Date End Date Taking? Authorizing Provider  amLODipine (NORVASC) 5 MG tablet Take 5 mg by mouth daily.   Yes Historical Provider, MD  aspirin EC 81 MG tablet Take 81 mg by mouth daily.   Yes Historical Provider, MD  hydrochlorothiazide (HYDRODIURIL) 25 MG tablet Take 25 mg by mouth daily.   Yes Historical Provider, MD  losartan (COZAAR) 50 MG tablet Take 50 mg by mouth daily.   Yes Historical Provider, MD  oxazepam (SERAX) 15 MG capsule Take 15 mg by mouth at bedtime as needed for sleep or anxiety.   Yes Historical Provider, MD  PRESCRIPTION MEDICATION Take 1 tablet by mouth 3 (three) times daily as needed. Muscle relaxer.   Yes Historical Provider, MD  ranitidine (ZANTAC) 300 MG tablet Take 300 mg by mouth at bedtime.   Yes Historical Provider, MD  sertraline (ZOLOFT) 25 MG tablet Take 25 mg by mouth daily.   Yes Historical Provider, MD  simvastatin (ZOCOR) 20 MG tablet Take 20 mg by mouth every evening.   Yes Historical Provider, MD   Dg Chest 1 View  04/12/2013   *RADIOLOGY REPORT*  Clinical Data: Preoperative chest radiograph for right hip fracture.  CHEST - 1 VIEW  Comparison: None.  Findings: The lungs are mildly hyperexpanded, with flattening of the hemidiaphragms, likely reflecting COPD.  Vascular  congestion may be transient in nature.  No definite focal consolidation, pleural effusion or pneumothorax is seen.  The cardiomediastinal silhouette is normal in size; calcification is noted in the aortic arch.  No acute osseous abnormalities are seen.  IMPRESSION: Suspect COPD; vascular congestion may be transient in nature. Lungs otherwise clear.   Original Report Authenticated By: Tonia Ghent, M.D.   Dg Hip Portable 1 View Right  04/12/2013   *RADIOLOGY REPORT*  Clinical Data: Hip pain.  Evaluate for possible hip fracture.  PORTABLE RIGHT HIP - 1 VIEW  Comparison: Right femur radiograph 04/12/2013.  Findings: Single view of the right hip  redemonstrates the presence of a displaced subcapital fracture of the right femoral neck.  This was better demonstrated on the recent study obtained at [facility] on 04/12/2013.  Mild medial displacement of the distal femoral fragment is noted.  On this single-view examination, the femoral head appears located within the right acetabulum (it was located on the prior study).  IMPRESSION: 1.  Mildly displaced subcapital right femoral neck fracture redemonstrated, as above.   Original Report Authenticated By: Trudie Reed, M.D.    Positive ROS: All other systems have been reviewed and were otherwise negative with the exception of those mentioned in the HPI and as above.  Physical Exam: General: Alert, no acute distress, moderately frail, but interactive. Cardiovascular: Trace pedal edema Respiratory: No cyanosis, no use of accessory musculature GI: No organomegaly, abdomen is soft and non-tender, however it is somewhat prominent, but soft, with no rebound or guarding. Skin: No lesions in the area of chief complaint with the exception of significant bruising over the right hip. Neurologic: Sensation intact distally Psychiatric: Patient is competent for consent with normal mood and affect Lymphatic: No axillary or cervical lymphadenopathy  MUSCULOSKELETAL: Right elbow has reasonably good range of motion with no crepitance and minimal pain to palpation, but there is bruising. The right hip has positive log roll, EHL and FHL are firing. Calves are soft.  Assessment: Right femoral neck fracture, moderate displacement, with multiple other comorbidities as indicated above.  Plan: This is an acute injury, severe in nature, which threatens her daily function, her independent, and carries risk of both for morbidity and mortality.  I have discussed on the phone with the daughter twice today, and we are planning a family meeting tomorrow for 12:00 to review surgical and nonsurgical options. The  patient herself is certainly competent for consent, and I have discussed the risks benefits and alternatives with the patient herself, and she understands and wishes to proceed.  We are planning for surgery probably tomorrow afternoon, depending on the OR availability.  I have answered all the patient's questions, and will review further details with her daughter tomorrow.   Eulas Post, MD Cell [phone]   04/12/2013 10:17 PM

## 2013-04-12 NOTE — Progress Notes (Signed)
I was called by Dr. Tobin Chad office requesting assistance with her care.  The family has requested our involvement, and I will plan to do full consult later tonight, and I have spoken with the family, and surgery planned for Friday, time TBD.  She will need right hip hemiarthroplasty.  Eulas Post, MD

## 2013-04-12 NOTE — H&P (Addendum)
[facility]                                                                                    Patient Demographics  Rita Chapman, is a 79 y.o. female  CSN: [id number]  MRN: [id number]  DOB - 03-07-1926  Admit Date - 04/12/2013  Outpatient Primary MD for the patient is Doreen Salvage, MD   With History of -  Past Medical History  Diagnosis Date  . Hypertension   . Hypercholesterolemia       History reviewed. No pertinent past surgical history.  in for   Chief Complaint  Patient presents with  . Right hip fracture      HPI  Rita Chapman  is a 79 y.o. female, with history of hypertension, dyslipidemia, osteoporosis, depression, anxiety who is in good health for her stated age comes to the hospital being transferred from [facility]. Patient apparently was on a trip with her daughter, she tripped and fell while getting out of the car late last night, she fell on her right hip and experienced pain in that area, she then went to [facility] where she was diagnosed with right-sided femoral fracture, family then requested that patient be sent to [facility] for further care. She then arrived to the ER here where her lab work was essentially unremarkable except for mild reactionary leukocytosis, case was discussed with Dr. Lajoyce Corners orthopedic surgeon by the ER MD who requested hospitalist admission.  Patient besides mild right-sided hip discomfort is symptom free, she does not have any history of heart problems including no CAD, no CHF, no dysrhythmia, no valvular problems, she is fairly active, no exertional chest pain or shortness of breath. Denies any history of COPD or lung problems    Review of Systems    In addition to the HPI above,  No Fever-chills, No Headache, No changes with Vision or hearing, No problems swallowing food or Liquids, No Chest pain, Cough or Shortness of Breath, No Abdominal pain, No Nausea or Vommitting, Bowel movements are regular, No  Blood in stool or Urine, No dysuria, No new skin rashes or bruises, No new joints pains-aches, R hip pain. No new weakness, tingling, numbness in any extremity, No recent weight gain or loss, No polyuria, polydypsia or polyphagia, No significant Mental Stressors.  A full 10 point Review of Systems was done, except as stated above, all other Review of Systems were negative.   Social History History  Substance Use Topics  . Smoking status: Never Smoker   . Smokeless tobacco: Not on file  . Alcohol Use: No     Family History  No cad at early age   Prior to Admission medications   Medication Sig Start Date End Date Taking? Authorizing Provider  amLODipine (NORVASC) 5 MG tablet Take 5 mg by mouth daily.   Yes Historical Provider, MD  aspirin EC 81 MG tablet Take 81 mg by mouth daily.   Yes Historical Provider, MD  hydrochlorothiazide (HYDRODIURIL) 25 MG tablet Take 25 mg by mouth daily.   Yes Historical Provider, MD  losartan (COZAAR) 50 MG tablet Take 50 mg by mouth daily.   Yes Historical Provider,  MD  oxazepam (SERAX) 15 MG capsule Take 15 mg by mouth at bedtime as needed for sleep or anxiety.   Yes Historical Provider, MD  PRESCRIPTION MEDICATION Take 1 tablet by mouth 3 (three) times daily as needed. Muscle relaxer.   Yes Historical Provider, MD  ranitidine (ZANTAC) 300 MG tablet Take 300 mg by mouth at bedtime.   Yes Historical Provider, MD  sertraline (ZOLOFT) 25 MG tablet Take 25 mg by mouth daily.   Yes Historical Provider, MD  simvastatin (ZOCOR) 20 MG tablet Take 20 mg by mouth every evening.   Yes Historical Provider, MD    Allergies  Allergen Reactions  . Bactrim (Sulfamethoxazole W-Trimethoprim) Other (See Comments)    Unknown   . Sulfa Antibiotics Other (See Comments)    Unknown     Physical Exam  Vitals  Blood pressure 162/70, pulse 102, temperature 98.8 F (37.1 C), temperature source Oral, resp. rate 19, height 4\' 8"  (1.422 m), weight 46.267 kg (102  lb), SpO2 100.00%.   1. General frail elderly white female lying in bed in NAD,  2. Normal affect and insight, Not Suicidal or Homicidal, Awake Alert, Oriented X 3.  3. No F.N deficits, ALL C.Nerves Intact, Strength 5/5 all 4 extremities, Sensation intact all 4 extremities, Plantars down going.  4. Ears and Eyes appear Normal, Conjunctivae clear, PERRLA. Moist Oral Mucosa.  5. Supple Neck, No JVD, No cervical lymphadenopathy appriciated, No Carotid Bruits.  6. Symmetrical Chest wall movement, Good air movement bilaterally, CTAB.  7. RRR, No Gallops, Rubs or Murmurs, No Parasternal Heave.  8. Positive Bowel Sounds, Abdomen Soft, Non tender, No organomegaly appriciated,No rebound -guarding or rigidity.  9.  No Cyanosis, Normal Skin Turgor, No Skin Rash or Bruise.  10. Good muscle tone,  joints appear normal , no effusions, Normal ROM.  11. No Palpable Lymph Nodes in Neck or Axillae    Data Review  CBC  Recent Labs Lab 04/12/13 0345  WBC 12.9*  HGB 14.2  HCT 40.4  PLT 149*  MCV 87.4  MCH 30.7  MCHC 35.1  RDW 13.1   ------------------------------------------------------------------------------------------------------------------  Chemistries   Recent Labs Lab 04/12/13 0345  NA 138  K 3.5  CL 98  CO2 25  GLUCOSE 130*  BUN 16  CREATININE 0.60  CALCIUM 10.4  AST 33  ALT 26  ALKPHOS 89  BILITOT 0.6   ------------------------------------------------------------------------------------------------------------------ estimated creatinine clearance is 33.3 ml/min (by C-G formula based on Cr of 0.6). ------------------------------------------------------------------------------------------------------------------ No results found for this basename: TSH, T4TOTAL, FREET3, T3FREE, THYROIDAB,  in the last 72 hours   Coagulation profile  Recent Labs Lab 04/12/13 0345  INR 1.04    ------------------------------------------------------------------------------------------------------------------- No results found for this basename: DDIMER,  in the last 72 hours -------------------------------------------------------------------------------------------------------------------  Cardiac Enzymes No results found for this basename: CK, CKMB, TROPONINI, MYOGLOBIN,  in the last 168 hours ------------------------------------------------------------------------------------------------------------------ No components found with this basename: POCBNP,    ---------------------------------------------------------------------------------------------------------------  Urinalysis    Component Value Date/Time   COLORURINE YELLOW 04/12/2013 0511   APPEARANCEUR CLEAR 04/12/2013 0511   LABSPEC 1.012 04/12/2013 0511   PHURINE 7.5 04/12/2013 0511   GLUCOSEU NEGATIVE 04/12/2013 0511   HGBUR NEGATIVE 04/12/2013 0511   BILIRUBINUR NEGATIVE 04/12/2013 0511   KETONESUR 40* 04/12/2013 0511   PROTEINUR NEGATIVE 04/12/2013 0511   UROBILINOGEN 1.0 04/12/2013 0511   NITRITE NEGATIVE 04/12/2013 0511   LEUKOCYTESUR NEGATIVE 04/12/2013 0511    ----------------------------------------------------------------------------------------------------------------  Imaging results:   Dg Chest 1 View  04/12/2013   *  RADIOLOGY REPORT*  Clinical Data: Preoperative chest radiograph for right hip fracture.  CHEST - 1 VIEW  Comparison: None.  Findings: The lungs are mildly hyperexpanded, with flattening of the hemidiaphragms, likely reflecting COPD.  Vascular congestion may be transient in nature.  No definite focal consolidation, pleural effusion or pneumothorax is seen.  The cardiomediastinal silhouette is normal in size; calcification is noted in the aortic arch.  No acute osseous abnormalities are seen.  IMPRESSION: Suspect COPD; vascular congestion may be transient in nature. Lungs otherwise clear.   Original  Report Authenticated By: Tonia Ghent, M.D.    My personal review of EKG: Rhythm S.Tach , Rate  101/min,  no Acute ST changes    Assessment & Plan   1. Mechanical fall with right-sided femoral fracture- will get a repeat x-ray here for baseline, she will be seen by Dr. Lajoyce Corners for possible surgical intervention per Er MD, post surgery she will be seen by PT and social work, for now she will be kept n.p.o., gentle IV fluids, SCDs for DVT prophylaxis, post surgery DVT prophylaxis per orthopedics, pain control. Patient will be low to moderate risk for adverse cardiopulmonary outcome during the perioperative period due to her advanced age. Has been explained to the patient and daughter and both accept the risks and benefits, they would like to proceed with the surgery.     Cardio-Pulm Risk stratification for surgery and recommendations to minimize the same:-  A.Cardio-Pulmonary Risk -  this patient is a low to moderate for adverse Cardio-Pulmonary  Outcome  from surgery, the risks and benefits were discussed and acceptable to her granddaughter  Recommendations for optimizing Cardio-Pulmonary  Risk risk factors  1. Keep SBP<140,  use Hydralazine 10mg  IV q4hrs PRN,  PRN. 2. Moniotr I&Os. 3. Minimal sedation and Narcotics. 4. Good pulmunary toilet. 5. PRN Nebs and as needed oxygen to keep Pox>90% 6. Hb>8, transfuse as needed- Lasix 10mg  IV after each unit PRBC Transfused.    B.Bleeding Risk - no previous surgical complications, no easy bruising, INR 1.04, Platecount 49, holding her aspirin today.   Addendum - 1pm called Dr Lajoyce Corners, he informed me that he never got called by the ER this am (as mentioned in the ER MDs note), he will request one of his partners to come and see the patient, surgery likely in am, called the RN diet ordered, DC IVF, D/W pt's daughter who is obviously disappointed, she wants Dr Sherlean Foot to see the patient, she knows him and  Has requested him to see the patient. Will call  [facility] and cancel the consult. Will call Dr Sherlean Foot too to confirm.    Lab Results  Component Value Date   PLT 149* 04/12/2013                  Lab Results  Component Value Date   INR 1.04 04/12/2013       Will request Surgeon to please Order Lovenox/DVT prophylaxis of his/her choice when OK from Surgeon's standpoint post op.     2. History of hypertension and dyslipidemia. Home medications will be continued unchanged, we'll add IV Hydralazine when necessary while she is n.p.o. for blood pressure and heart rate control.    3. History of GERD continue PPI    4. History of osteoporosis continue vitamin D and calcium supplements with out patient monitoring post discharge.    5. Addendum - was called at 2.50pm pt on Tele had a 2 sec Pause - no symptoms, will  DC PRN lopressor, check TSH, Tele monitor.Cards input.      DVT Prophylaxis  SCDs   AM Labs Ordered, also please review Full Orders  Family Communication: Admission, patients condition and plan of care including tests being ordered have been discussed with the patient and daughter who indicate understanding and agree with the plan and Code Status.  Code Status DNR  Likely DC to  Home  Time spent in minutes : 35  Condition Marinell Blight K M.D on 04/12/2013 at 8:06 AM  Between 7am to 7pm - Pager - [phone]  After 7pm go to [email or web address] - password [email or web address]  And look for the night coverage person covering me after hours  [facility] Office  [phone]

## 2013-04-12 NOTE — Progress Notes (Signed)
PT Cancellation Note  Patient Details Name: Olie Scaffidi MRN: [id number] DOB: 1926-03-24   Cancelled Treatment:    Reason Eval/Treat Not Completed: Medical issues which prohibited therapy (New hip fx.  Awaiting ortho eval and decisions.)   Tawsha Terrero 04/12/2013, 1:35 PM

## 2013-04-13 ENCOUNTER — Inpatient Hospital Stay (HOSPITAL_COMMUNITY): Payer: Medicare Other

## 2013-04-13 ENCOUNTER — Encounter (HOSPITAL_COMMUNITY): Payer: Self-pay | Admitting: Anesthesiology

## 2013-04-13 ENCOUNTER — Inpatient Hospital Stay (HOSPITAL_COMMUNITY): Payer: Medicare Other | Admitting: Anesthesiology

## 2013-04-13 ENCOUNTER — Encounter (HOSPITAL_COMMUNITY): Payer: Self-pay | Admitting: *Deleted

## 2013-04-13 ENCOUNTER — Encounter (HOSPITAL_COMMUNITY)
Admission: EM | Disposition: A | Discharge status: Skilled Nursing Facility | Payer: Self-pay | Source: Home / Self Care | Attending: Internal Medicine

## 2013-04-13 DIAGNOSIS — S72009A Fracture of unspecified part of neck of unspecified femur, initial encounter for closed fracture: Secondary | ICD-10-CM

## 2013-04-13 HISTORY — PX: HIP ARTHROPLASTY: SHX981

## 2013-04-13 LAB — COMPREHENSIVE METABOLIC PANEL
ALT: 14 U/L (ref 0–35)
AST: 19 U/L (ref 0–37)
Albumin: 3.2 g/dL — ABNORMAL LOW (ref 3.5–5.2)
CO2: 28 mEq/L (ref 19–32)
Calcium: 9.4 mg/dL (ref 8.4–10.5)
GFR calc non Af Amer: 82 mL/min — ABNORMAL LOW (ref >90)
Sodium: 131 mEq/L — ABNORMAL LOW (ref 135–145)

## 2013-04-13 LAB — CBC
MCH: 30.8 pg (ref 26.0–34.0)
Platelets: 107 10*3/uL — ABNORMAL LOW (ref 150–400)
RBC: 3.77 MIL/uL — ABNORMAL LOW (ref 3.87–5.11)
RDW: 12.8 % (ref 11.5–15.5)
WBC: 6.6 10*3/uL (ref 4.0–10.5)

## 2013-04-13 SURGERY — HEMIARTHROPLASTY, HIP, DIRECT ANTERIOR APPROACH, FOR FRACTURE
Anesthesia: General | Site: Hip | Laterality: Right | Wound class: Clean

## 2013-04-13 MED ORDER — ONDANSETRON HCL 4 MG/2ML IJ SOLN
INTRAMUSCULAR | Status: AC
  Filled 2013-04-13: qty 2

## 2013-04-13 MED ORDER — BUPIVACAINE HCL (PF) 0.5 % IJ SOLN
INTRAMUSCULAR | Status: DC | PRN
  Administered 2013-04-13: 20 mL

## 2013-04-13 MED ORDER — FENTANYL CITRATE 0.05 MG/ML IJ SOLN
INTRAMUSCULAR | Status: AC
  Administered 2013-04-13: 25 ug via INTRAVENOUS
  Filled 2013-04-13: qty 2

## 2013-04-13 MED ORDER — FENTANYL CITRATE 0.05 MG/ML IJ SOLN: 25.0000 ug | INTRAMUSCULAR | Status: DC | PRN

## 2013-04-13 MED ORDER — ACETAMINOPHEN 650 MG RE SUPP: 650.0000 mg | Freq: Four times a day (QID) | RECTAL | Status: DC | PRN

## 2013-04-13 MED ORDER — SENNA-DOCUSATE SODIUM 8.6-50 MG PO TABS: 2.0000 | ORAL_TABLET | Freq: Every day | ORAL | Status: DC

## 2013-04-13 MED ORDER — CEFAZOLIN SODIUM-DEXTROSE 2-3 GM-% IV SOLR
2.0000 g | Freq: Four times a day (QID) | INTRAVENOUS | Status: AC
  Administered 2013-04-13 – 2013-04-14 (×2): 2 g via INTRAVENOUS
  Filled 2013-04-13 (×2): qty 50

## 2013-04-13 MED ORDER — CEFAZOLIN SODIUM-DEXTROSE 2-3 GM-% IV SOLR
INTRAVENOUS | Status: AC
  Administered 2013-04-13: 2 g via INTRAVENOUS
  Filled 2013-04-13: qty 50

## 2013-04-13 MED ORDER — ALUM & MAG HYDROXIDE-SIMETH 200-200-20 MG/5ML PO SUSP: 30.0000 mL | ORAL | Status: DC | PRN

## 2013-04-13 MED ORDER — LACTATED RINGERS IV SOLN
INTRAVENOUS | Status: DC | PRN
  Administered 2013-04-13 (×2): via INTRAVENOUS

## 2013-04-13 MED ORDER — PROPOFOL 10 MG/ML IV BOLUS
INTRAVENOUS | Status: DC | PRN
  Administered 2013-04-13: 120 mg via INTRAVENOUS

## 2013-04-13 MED ORDER — MORPHINE SULFATE 2 MG/ML IJ SOLN
0.5000 mg | INTRAMUSCULAR | Status: DC | PRN
  Administered 2013-04-13: 0.5 mg via INTRAVENOUS
  Filled 2013-04-13: qty 1

## 2013-04-13 MED ORDER — BISACODYL 10 MG RE SUPP: 10.0000 mg | Freq: Every day | RECTAL | Status: DC | PRN

## 2013-04-13 MED ORDER — ENOXAPARIN SODIUM 40 MG/0.4ML ~~LOC~~ SOLN: 40.0000 mg | SUBCUTANEOUS | Status: DC

## 2013-04-13 MED ORDER — MENTHOL 3 MG MT LOZG
1.0000 | LOZENGE | OROMUCOSAL | Status: DC | PRN
  Filled 2013-04-13: qty 9

## 2013-04-13 MED ORDER — ONDANSETRON HCL 4 MG/2ML IJ SOLN: 4.0000 mg | Freq: Once | INTRAMUSCULAR | Status: DC | PRN

## 2013-04-13 MED ORDER — BUPIVACAINE HCL (PF) 0.5 % IJ SOLN
INTRAMUSCULAR | Status: AC
  Filled 2013-04-13: qty 30

## 2013-04-13 MED ORDER — ONDANSETRON HCL 4 MG/2ML IJ SOLN
INTRAMUSCULAR | Status: DC | PRN
  Administered 2013-04-13: 4 mg via INTRAVENOUS

## 2013-04-13 MED ORDER — SODIUM CHLORIDE 0.9 % IR SOLN
Status: DC | PRN
  Administered 2013-04-13: 3000 mL

## 2013-04-13 MED ORDER — NEOSTIGMINE METHYLSULFATE 1 MG/ML IJ SOLN
INTRAMUSCULAR | Status: DC | PRN
  Administered 2013-04-13: 3 mg via INTRAVENOUS

## 2013-04-13 MED ORDER — FLEET ENEMA 7-19 GM/118ML RE ENEM: 1.0000 | ENEMA | Freq: Once | RECTAL | Status: AC | PRN

## 2013-04-13 MED ORDER — POTASSIUM CHLORIDE IN NACL 20-0.45 MEQ/L-% IV SOLN
INTRAVENOUS | Status: DC
  Administered 2013-04-13: 23:00:00 via INTRAVENOUS
  Filled 2013-04-13 (×3): qty 1000

## 2013-04-13 MED ORDER — GLYCOPYRROLATE 0.2 MG/ML IJ SOLN
INTRAMUSCULAR | Status: DC | PRN
  Administered 2013-04-13: .4 mg via INTRAVENOUS

## 2013-04-13 MED ORDER — ARTIFICIAL TEARS OP OINT
TOPICAL_OINTMENT | OPHTHALMIC | Status: DC | PRN
  Administered 2013-04-13: 1 via OPHTHALMIC

## 2013-04-13 MED ORDER — HYDROCODONE-ACETAMINOPHEN 5-325 MG PO TABS
1.0000 | ORAL_TABLET | Freq: Four times a day (QID) | ORAL | Status: DC | PRN
  Administered 2013-04-14 – 2013-04-15 (×2): 2 via ORAL
  Administered 2013-04-16: 1 via ORAL
  Administered 2013-04-17: 2 via ORAL
  Filled 2013-04-13 (×3): qty 2
  Filled 2013-04-13: qty 1
  Filled 2013-04-13: qty 2

## 2013-04-13 MED ORDER — DOCUSATE SODIUM 100 MG PO CAPS
100.0000 mg | ORAL_CAPSULE | Freq: Two times a day (BID) | ORAL | Status: DC
  Administered 2013-04-14 – 2013-04-17 (×8): 100 mg via ORAL
  Filled 2013-04-13 (×11): qty 1

## 2013-04-13 MED ORDER — ROCURONIUM BROMIDE 100 MG/10ML IV SOLN
INTRAVENOUS | Status: DC | PRN
  Administered 2013-04-13: 30 mg via INTRAVENOUS

## 2013-04-13 MED ORDER — POLYETHYLENE GLYCOL 3350 17 G PO PACK: 17.0000 g | PACK | Freq: Every day | ORAL | Status: DC | PRN

## 2013-04-13 MED ORDER — PHENOL 1.4 % MT LIQD: 1.0000 | OROMUCOSAL | Status: DC | PRN

## 2013-04-13 MED ORDER — LIDOCAINE HCL (CARDIAC) 20 MG/ML IV SOLN
INTRAVENOUS | Status: DC | PRN
  Administered 2013-04-13: 70 mg via INTRAVENOUS

## 2013-04-13 MED ORDER — ACETAMINOPHEN 325 MG PO TABS
650.0000 mg | ORAL_TABLET | Freq: Four times a day (QID) | ORAL | Status: DC | PRN
  Administered 2013-04-14 – 2013-04-15 (×3): 650 mg via ORAL
  Filled 2013-04-13 (×3): qty 2

## 2013-04-13 MED ORDER — HYDROCODONE-ACETAMINOPHEN 5-325 MG PO TABS: 1.0000 | ORAL_TABLET | Freq: Four times a day (QID) | ORAL | Status: DC | PRN

## 2013-04-13 MED ORDER — LACTATED RINGERS IV SOLN
INTRAVENOUS | Status: DC
  Administered 2013-04-13: 15:00:00 via INTRAVENOUS

## 2013-04-13 MED ORDER — FENTANYL CITRATE 0.05 MG/ML IJ SOLN
INTRAMUSCULAR | Status: DC | PRN
  Administered 2013-04-13 (×3): 50 ug via INTRAVENOUS

## 2013-04-13 MED ORDER — ENOXAPARIN SODIUM 40 MG/0.4ML ~~LOC~~ SOLN
40.0000 mg | SUBCUTANEOUS | Status: DC
Start: 2013-04-14 — End: 2013-04-17
  Administered 2013-04-14 – 2013-04-17 (×4): 40 mg via SUBCUTANEOUS
  Filled 2013-04-13 (×6): qty 0.4

## 2013-04-13 SURGICAL SUPPLY — 54 items
BENZOIN TINCTURE PRP APPL 2/3 (GAUZE/BANDAGES/DRESSINGS) ×2 IMPLANT
BLADE SAW SGTL 18.5X63.X.64 HD (BLADE) ×2 IMPLANT
BRUSH FEMORAL CANAL (MISCELLANEOUS) ×2 IMPLANT
CEMENT BONE DEPUY (Cement) ×4 IMPLANT
CLOTH BEACON ORANGE TIMEOUT ST (SAFETY) ×2 IMPLANT
COVER BACK TABLE 24X17X13 BIG (DRAPES) IMPLANT
COVER SURGICAL LIGHT HANDLE (MISCELLANEOUS) ×2 IMPLANT
DRAPE INCISE IOBAN 66X45 STRL (DRAPES) ×2 IMPLANT
DRAPE ORTHO SPLIT 77X108 STRL (DRAPES) ×2
DRAPE SURG ORHT 6 SPLT 77X108 (DRAPES) ×2 IMPLANT
DRAPE U-SHAPE 47X51 STRL (DRAPES) ×2 IMPLANT
DRILL BIT 5/64 (BIT) ×2 IMPLANT
DRSG MEPILEX BORDER 4X12 (GAUZE/BANDAGES/DRESSINGS) IMPLANT
DRSG MEPILEX BORDER 4X8 (GAUZE/BANDAGES/DRESSINGS) ×2 IMPLANT
DURAPREP 26ML APPLICATOR (WOUND CARE) ×2 IMPLANT
ELECT BLADE 6.5 EXT (BLADE) IMPLANT
ELECT CAUTERY BLADE 6.4 (BLADE) ×2 IMPLANT
ELECT REM PT RETURN 9FT ADLT (ELECTROSURGICAL) ×2
ELECTRODE REM PT RTRN 9FT ADLT (ELECTROSURGICAL) ×1 IMPLANT
FACESHIELD LNG OPTICON STERILE (SAFETY) ×4 IMPLANT
GLOVE BIOGEL PI ORTHO PRO SZ8 (GLOVE) ×1
GLOVE ORTHO TXT STRL SZ7.5 (GLOVE) ×2 IMPLANT
GLOVE PI ORTHO PRO STRL SZ8 (GLOVE) ×1 IMPLANT
GLOVE SURG ORTHO 8.0 STRL STRW (GLOVE) ×4 IMPLANT
GOWN STRL NON-REIN LRG LVL3 (GOWN DISPOSABLE) IMPLANT
HANDPIECE INTERPULSE COAX TIP (DISPOSABLE) ×1
KIT BASIN OR (CUSTOM PROCEDURE TRAY) ×2 IMPLANT
KIT ROOM TURNOVER OR (KITS) ×2 IMPLANT
MANIFOLD NEPTUNE II (INSTRUMENTS) ×2 IMPLANT
NEEDLE 22X1 1/2 (OR ONLY) (NEEDLE) ×2 IMPLANT
NS IRRIG 1000ML POUR BTL (IV SOLUTION) ×2 IMPLANT
PACK TOTAL JOINT (CUSTOM PROCEDURE TRAY) ×2 IMPLANT
PAD ARMBOARD 7.5X6 YLW CONV (MISCELLANEOUS) ×4 IMPLANT
PASSER SUT SWANSON 36MM LOOP (INSTRUMENTS) ×2 IMPLANT
PILLOW ABDUCTION HIP (SOFTGOODS) ×2 IMPLANT
PRESSURIZER FEMORAL UNIV (MISCELLANEOUS) ×2 IMPLANT
RESTRICTOR CEMENT PE SZ 2 (Cement) ×2 IMPLANT
RETRIEVER SUT HEWSON (MISCELLANEOUS) ×2 IMPLANT
SET HNDPC FAN SPRY TIP SCT (DISPOSABLE) ×1 IMPLANT
STRIP CLOSURE SKIN 1/2X4 (GAUZE/BANDAGES/DRESSINGS) ×2 IMPLANT
SUT FIBERWIRE #2 38 REV NDL BL (SUTURE) ×4
SUT MNCRL AB 4-0 PS2 18 (SUTURE) ×2 IMPLANT
SUT VIC AB 0 CT1 27 (SUTURE) ×1
SUT VIC AB 0 CT1 27XBRD ANBCTR (SUTURE) ×1 IMPLANT
SUT VIC AB 1 CT1 27 (SUTURE) ×2
SUT VIC AB 1 CT1 27XBRD ANBCTR (SUTURE) ×2 IMPLANT
SUT VIC AB 3-0 SH 8-18 (SUTURE) ×2 IMPLANT
SUTURE FIBERWR#2 38 REV NDL BL (SUTURE) ×2 IMPLANT
SYR CONTROL 10ML LL (SYRINGE) ×2 IMPLANT
TOWEL OR 17X24 6PK STRL BLUE (TOWEL DISPOSABLE) ×2 IMPLANT
TOWEL OR 17X26 10 PK STRL BLUE (TOWEL DISPOSABLE) ×2 IMPLANT
TOWER CARTRIDGE SMART MIX (DISPOSABLE) ×2 IMPLANT
TRAY FOLEY CATH 14FR (SET/KITS/TRAYS/PACK) ×2 IMPLANT
WATER STERILE IRR 1000ML POUR (IV SOLUTION) ×8 IMPLANT

## 2013-04-13 NOTE — Preoperative (Signed)
Beta Blockers   Reason not to administer Beta Blockers:Not Applicable 

## 2013-04-13 NOTE — Anesthesia Postprocedure Evaluation (Signed)
Anesthesia Post Note  Patient: Rita Chapman  Procedure(s) Performed: Procedure(s) (LRB): RIGHT HIP ARTHROPLASTY BIPOLAR  (Right)  Anesthesia type: General  Patient location: PACU  Post pain: Pain level controlled and Adequate analgesia  Post assessment: Post-op Vital signs reviewed, Patient's Cardiovascular Status Stable, Respiratory Function Stable, Patent Airway and Pain level controlled  Last Vitals:  Filed Vitals:   04/13/13 2040  BP: 151/46  Pulse: 87  Temp:   Resp: 19    Post vital signs: Reviewed and stable  Level of consciousness: awake, alert  and oriented  Complications: No apparent anesthesia complications

## 2013-04-13 NOTE — Anesthesia Procedure Notes (Signed)
Procedure Name: Intubation Date/Time: 04/13/2013 6:37 PM Performed by: Brien Mates DOBSON Pre-anesthesia Checklist: Patient identified, Emergency Drugs available, Suction available, Patient being monitored and Timeout performed Patient Re-evaluated:Patient Re-evaluated prior to inductionOxygen Delivery Method: Circle system utilized Preoxygenation: Pre-oxygenation with 100% oxygen Intubation Type: IV induction Ventilation: Mask ventilation without difficulty Laryngoscope Size: Miller and 2 Grade View: Grade I Tube type: Oral Tube size: 7.5 mm Number of attempts: 1 Airway Equipment and Method: Stylet Placement Confirmation: ETT inserted through vocal cords under direct vision,  positive ETCO2 and breath sounds checked- equal and bilateral Secured at: 21 cm Tube secured with: Tape Dental Injury: Teeth and Oropharynx as per pre-operative assessment

## 2013-04-13 NOTE — Progress Notes (Signed)
The patient has been re-examined, and the chart reviewed, and there have been no interval changes to the documented history and physical.    The risks, benefits, and alternatives have been discussed at length, and the patient is willing to proceed.   

## 2013-04-13 NOTE — Op Note (Signed)
04/12/2013 - 04/13/2013  7:31 PM  PATIENT:  Rita Chapman   MRN: [id number]  PRE-OPERATIVE DIAGNOSIS:  RIGHT FEMORAL NECK FRACTURE   POST-OPERATIVE DIAGNOSIS:  RIGHT FEMORAL NECK FRACTURE   PROCEDURE:  Procedure(s): RIGHT HIP HEMIARTHROPLASTY   PREOPERATIVE INDICATIONS:  Rita Chapman is an 79 y.o. female who was admitted 04/12/2013 with a diagnosis of Fracture of femoral neck, right and elected for surgical management.  The risks benefits and alternatives were discussed with the patient including but not limited to the risks of nonoperative treatment, versus surgical intervention including infection, bleeding, nerve injury, periprosthetic fracture, the need for revision surgery, dislocation, leg length discrepancy, blood clots, cardiopulmonary complications, morbidity, mortality, among others, and they were willing to proceed.  Predicted outcome is good, although there will be at least a six to nine month expected recovery.   OPERATIVE REPORT     SURGEON:  Teryl Lucy, MD    ASSISTANT:  Janace Litten, OPA-C  (Present throughout the entire procedure,  necessary for completion of procedure in a timely manner, assisting with retraction, instrumentation, and closure)     ANESTHESIA:  General    COMPLICATIONS:  None.      COMPONENTS:  Depuy Summit Basic Femoral Fracture stem size 3, with a stem centralizer, a size 2 cement restrictor, with a -3 spacer and a size 43mm fracture head unipolar hip ball, and a total of 2 bags of Depuy CMW 1 Bone Cement.    PROCEDURE IN DETAIL: The patient was met in the holding area and identified.  The appropriate hip  was marked at the operative site. The patient was then transported to the OR and  placed under general l anesthesia.  At that point, the patient was  placed in the lateral decubitus position with the operative side up and  secured to the operating room table and all bony prominences padded.     The operative lower extremity was prepped from the  iliac crest to the toes.  Sterile draping was performed.  Time out was performed prior to incision.      A routine posterolateral approach was utilized via sharp dissection  carried down to the subcutaneous tissue.  Gross bleeders were Bovie  coagulated.  The iliotibial band was identified and incised  along the length of the skin incision.  Self-retaining retractors were  inserted.  With the hip internally rotated, the short external rotators  were identified. The piriformis was tagged with FiberWire, and the hip capsule released in a T-type fashion.  The femoral neck was exposed, and I resected the femoral neck using the appropriate jig. This was performed at approximately a thumb's breadth above the lesser trochanter.    I then exposed the deep acetabulum, cleared out any tissue including the ligamentum teres, and included the hip capsule in the FiberWire used above and below the T.    I then prepared the proximal femur using the cookie-cutter, the lateralizing reamer, and then sequentially broached.  A trial utilized, and I reduced the hip and it was found to have excellent stability with functional range of motion. The trial components were then removed. I started with the 0 neck, but that was too tight, so then converted to the -3, which had more appropriate soft tissue tension.  I then placed a cement restrictor, and cemented the real components in place. All excess cement was removed. Once the cement had cured, I impacted the real head ball into place. The hip was then reduced and taken through  functional range of motion and found to have excellent stability. Leg lengths were restored.  I then used a 2 mm drill bits to pass the FiberWire suture from the capsule and puriform is through the greater trochanter, and secured this. Excellent posterior capsular repair was achieved. I also closed the T in the capsule.  I then irrigated the hip copiously again with pulse lavage, and repaired the  fascia with Vicryl, followed by Vicryl for the subcutaneous tissue, Monocryl for the skin, Steri-Strips and sterile gauze. The wounds were injected. The patient was then awakened and returned to PACU in stable and satisfactory condition. There were no complications.  Teryl Lucy, MD Orthopedic Surgeon [phone]   04/13/2013 7:31 PM

## 2013-04-13 NOTE — Transfer of Care (Signed)
Immediate Anesthesia Transfer of Care Note  Patient: Rita Chapman  Procedure(s) Performed: Procedure(s): RIGHT HIP ARTHROPLASTY BIPOLAR  (Right)  Patient Location: PACU  Anesthesia Type:General  Level of Consciousness: awake  Airway & Oxygen Therapy: Patient Spontanous Breathing and Patient connected to face mask oxygen  Post-op Assessment: Report given to PACU RN and Post -op Vital signs reviewed and stable  Post vital signs: Reviewed and stable  Complications: No apparent anesthesia complications

## 2013-04-13 NOTE — Anesthesia Preprocedure Evaluation (Addendum)
Anesthesia Evaluation  Patient identified by MRN, date of birth, ID band Patient awake    Reviewed: Allergy & Precautions, H&P , NPO status , Patient's Chart, lab work & pertinent test results  Airway Mallampati: II TM Distance: >3 FB Neck ROM: full    Dental  (+) Edentulous Upper and Edentulous Lower   Pulmonary former smoker,    Pulmonary exam normal       Cardiovascular hypertension, Pt. on medications Rhythm:Regular Rate:Normal     Neuro/Psych Anxiety Depression negative neurological ROS     GI/Hepatic Neg liver ROS, GERD-  Medicated and Controlled,  Endo/Other  negative endocrine ROS  Renal/GU negative Renal ROS     Musculoskeletal  (+) Arthritis -, Osteoarthritis,    Abdominal   Peds  Hematology negative hematology ROS (+)   Anesthesia Other Findings   Reproductive/Obstetrics                         Anesthesia Physical Anesthesia Plan  ASA: II  Anesthesia Plan: General   Post-op Pain Management:    Induction: Intravenous  Airway Management Planned: Oral ETT  Additional Equipment:   Intra-op Plan:   Post-operative Plan: Extubation in OR  Informed Consent: I have reviewed the patients History and Physical, chart, labs and discussed the procedure including the risks, benefits and alternatives for the proposed anesthesia with the patient or authorized representative who has indicated his/her understanding and acceptance.     Plan Discussed with: CRNA, Anesthesiologist and Surgeon  Anesthesia Plan Comments:         Anesthesia Quick Evaluation

## 2013-04-13 NOTE — Progress Notes (Signed)
[facility] PROGRESS NOTE  Rita Chapman YNW:[id number] DOB: 06/03/1926 DOA: 04/12/2013 PCP: Doreen Salvage, MD  Assessment/Plan: 1. Mechanical Fall: with right femoral fracture. Going to OR today. Pain control and minimal sedation and PT eval tomorrow.  2. Hypertension: optimally controlled.  3. GERD; protonix.  4. Anemia: normocytic. Continue to monitor.  5. Hyponatremia: possibly from dehydration.   DVT prophylaxis.  Code Status: DNR Family Communication: none at bedside. Disposition Plan: pending PT eval after surgery.    Consultants:  Ortho pedics  cardiology  Procedures:  OR today  Antibiotics: 1 dose of cefazolin  HPI/Subjective: Very pleasant.   Objective: Filed Vitals:   04/13/13 0000 04/13/13 0400 04/13/13 0441 04/13/13 1055  BP:   130/63 149/60  Pulse:   80   Temp:   99.7 F (37.6 C)   TempSrc:   Oral   Resp: 16 18 16    Height:      Weight:      SpO2:   94%    No intake or output data in the 24 hours ending 04/13/13 1455 Filed Weights   04/12/13 0303  Weight: 46.267 kg (102 lb)    Exam:   Alert afebrile comfortable.  CVS: S1S2 rrr  LUngs; decreased air entry at bases.   Abdomen : soft NT ND BS+  Extremities: pain ROM.   Data Reviewed: Basic Metabolic Panel:  Recent Labs Lab 04/12/13 0345 04/13/13 0615  NA 138 131*  K 3.5 3.6  CL 98 95*  CO2 25 28  GLUCOSE 130* 151*  BUN 16 13  CREATININE 0.60 0.58  CALCIUM 10.4 9.4   Liver Function Tests:  Recent Labs Lab 04/12/13 0345 04/13/13 0615  AST 33 19  ALT 26 14  ALKPHOS 89 51  BILITOT 0.6 0.5  PROT 7.9 5.9*  ALBUMIN 4.5 3.2*   No results found for this basename: LIPASE, AMYLASE,  in the last 168 hours No results found for this basename: AMMONIA,  in the last 168 hours CBC:  Recent Labs Lab 04/12/13 0345 04/13/13 0615  WBC 12.9* 6.6  HGB 14.2 11.6*  HCT 40.4 33.0*  MCV 87.4 87.5  PLT 149* 107*   Cardiac Enzymes: No results found for this basename:  CKTOTAL, CKMB, CKMBINDEX, TROPONINI,  in the last 168 hours BNP (last 3 results) No results found for this basename: PROBNP,  in the last 8760 hours CBG: No results found for this basename: GLUCAP,  in the last 168 hours  No results found for this or any previous visit (from the past 240 hour(s)).   Studies: Dg Chest 1 View  04/12/2013   *RADIOLOGY REPORT*  Clinical Data: Preoperative chest radiograph for right hip fracture.  CHEST - 1 VIEW  Comparison: None.  Findings: The lungs are mildly hyperexpanded, with flattening of the hemidiaphragms, likely reflecting COPD.  Vascular congestion may be transient in nature.  No definite focal consolidation, pleural effusion or pneumothorax is seen.  The cardiomediastinal silhouette is normal in size; calcification is noted in the aortic arch.  No acute osseous abnormalities are seen.  IMPRESSION: Suspect COPD; vascular congestion may be transient in nature. Lungs otherwise clear.   Original Report Authenticated By: Tonia Ghent, M.D.   Dg Hip Portable 1 View Right  04/12/2013   *RADIOLOGY REPORT*  Clinical Data: Hip pain.  Evaluate for possible hip fracture.  PORTABLE RIGHT HIP - 1 VIEW  Comparison: Right femur radiograph 04/12/2013.  Findings: Single view of the right hip redemonstrates the presence of a displaced subcapital  fracture of the right femoral neck.  This was better demonstrated on the recent study obtained at [facility] on 04/12/2013.  Mild medial displacement of the distal femoral fragment is noted.  On this single-view examination, the femoral head appears located within the right acetabulum (it was located on the prior study).  IMPRESSION: 1.  Mildly displaced subcapital right femoral neck fracture redemonstrated, as above.   Original Report Authenticated By: Trudie Reed, M.D.    Scheduled Meds: . [MAR HOLD] amLODipine  5 mg Oral Daily  . [facility] HOLD] aspirin EC  81 mg Oral Daily  . [MAR HOLD]  ceFAZolin (ANCEF) IV  2 g  Intravenous 30 min Pre-Op  . [MAR HOLD] famotidine  10 mg Oral QHS  . [facility] HOLD] hydrochlorothiazide  25 mg Oral Daily  . [MAR HOLD] LORazepam  0.5 mg Oral BID  . [MAR HOLD] losartan  50 mg Oral Daily  . [MAR HOLD] senna  1 tablet Oral BID  . [facility] HOLD] sertraline  25 mg Oral Daily  . [facility] HOLD] simvastatin  20 mg Oral QPM   Continuous Infusions: . lactated ringers 50 mL/hr at 04/13/13 1445    Principal Problem:   Hip fracture Active Problems:   Hypertension   Hypercholesterolemia   Osteoporosis   Pre-operative cardiovascular examination    Time spent: 25 minutes.    [facility]  [facility] Pager [phone]. If 7PM-7AM, please contact night-coverage at [email or web address], password [facility] 04/13/2013, 2:55 PM  LOS: 1 day

## 2013-04-13 NOTE — Progress Notes (Signed)
PT Cancellation Note  Patient Details Name: Rita Chapman MRN: [id number] DOB: 1926-06-05   Cancelled Treatment:    Reason Eval/Treat Not Completed: Medical issues which prohibited therapy (New hip fx.  Per Ortho; Pt to need surgery.) Bed rest orders active, PT will sign off at this time. When patient medically appropriate for PT, please re-consult.   Thank you.   Fabio Asa 04/13/2013, 8:33 AM Charlotte Crumb, PT DPT  [phone]

## 2013-04-14 LAB — BASIC METABOLIC PANEL
CO2: 28 mEq/L (ref 19–32)
Calcium: 8.7 mg/dL (ref 8.4–10.5)
Chloride: 93 mEq/L — ABNORMAL LOW (ref 96–112)
Glucose, Bld: 124 mg/dL — ABNORMAL HIGH (ref 70–99)
Potassium: 4.1 mEq/L (ref 3.5–5.1)
Sodium: 128 mEq/L — ABNORMAL LOW (ref 135–145)

## 2013-04-14 LAB — CBC
Hemoglobin: 10.1 g/dL — ABNORMAL LOW (ref 12.0–15.0)
Platelets: 122 10*3/uL — ABNORMAL LOW (ref 150–400)
RBC: 3.37 MIL/uL — ABNORMAL LOW (ref 3.87–5.11)
WBC: 6.9 10*3/uL (ref 4.0–10.5)

## 2013-04-14 MED ORDER — SODIUM CHLORIDE 0.9 % IV SOLN
INTRAVENOUS | Status: DC
  Administered 2013-04-14 – 2013-04-15 (×2): via INTRAVENOUS

## 2013-04-14 MED ORDER — ONDANSETRON HCL 4 MG/2ML IJ SOLN
INTRAMUSCULAR | Status: AC
  Administered 2013-04-14: 10:00:00
  Filled 2013-04-14: qty 2

## 2013-04-14 MED ORDER — ONDANSETRON HCL 4 MG PO TABS: 4.0000 mg | ORAL_TABLET | Freq: Three times a day (TID) | ORAL | Status: DC | PRN

## 2013-04-14 MED ORDER — ONDANSETRON HCL 4 MG/2ML IJ SOLN
4.0000 mg | Freq: Three times a day (TID) | INTRAMUSCULAR | Status: DC | PRN
  Administered 2013-04-14: 4 mg via INTRAVENOUS

## 2013-04-14 MED ORDER — HYDROMORPHONE HCL PF 1 MG/ML IJ SOLN: 0.5000 mg | INTRAMUSCULAR | Status: DC | PRN

## 2013-04-14 MED ORDER — HALOPERIDOL LACTATE 5 MG/ML IJ SOLN: 1.0000 mg | INTRAMUSCULAR | Status: DC | PRN

## 2013-04-14 NOTE — Evaluation (Signed)
Physical Therapy Evaluation Patient Details Name: Rita Chapman MRN: [id number] DOB: [birth year] Today's Date: 04/14/2013 Time: 4098-1191 PT Time Calculation (min): 18 min  PT Assessment / Plan / Recommendation Clinical Impression  Patient s/p fall and now Hip hemiarthoplasty.  Patient was very independent prior to admission and is motivated to return home alone.  Patient did well with mobility but was limited by fatigue and posterior lean, increasing dependence with mobility.  WIll benefit from PT to increase independence and mobility for eventual d/c home.  Agree patient will need inpatient  post-acute rehab prior to returning home.    PT Assessment  Patient needs continued PT services    Follow Up Recommendations  CIR    Does the patient have the potential to tolerate intense rehabilitation      Barriers to Discharge        Equipment Recommendations  Rolling walker with 5" wheels    Recommendations for Other Services     Frequency Min 5X/week    Precautions / Restrictions Precautions Precautions: Posterior Hip Restrictions Weight Bearing Restrictions: Yes RLE Weight Bearing: Weight bearing as tolerated   Pertinent Vitals/Pain Patient with some pain, 4/10 during session      Mobility  Bed Mobility Bed Mobility: Supine to Sit;Sitting - Scoot to Edge of Bed Supine to Sit: 3: Mod assist;HOB elevated;With rails Sitting - Scoot to Edge of Bed: 4: Min assist Details for Bed Mobility Assistance: required assistance to scoot to lift shoulders off bed and to scoot to edge of bed Transfers Transfers: Sit to Stand;Stand to Sit Sit to Stand: 1: +2 Total assist;With upper extremity assist;From bed Sit to Stand: Patient Percentage: 50% Stand to Sit: 1: +2 Total assist;With upper extremity assist;To chair/3-in-1 Stand to Sit: Patient Percentage: 50% Details for Transfer Assistance: patient leaning posteriorily during transfers and while standing; required assistance to maintain  upright. Ambulation/Gait Ambulation/Gait Assistance: 1: +2 Total assist Ambulation/Gait: Patient Percentage: 50% Ambulation Distance (Feet): 2 Feet Assistive device: Rolling walker Ambulation/Gait Assistance Details: posterior lean during gait; requiring assistance to maintain upright. Gait Pattern: Step-to pattern    Exercises Total Joint Exercises Ankle Circles/Pumps: AROM;Strengthening;Both;10 reps;Seated Quad Sets: AROM;Strengthening;Both;10 reps;Seated   PT Diagnosis: Difficulty walking  PT Problem List: Decreased strength;Decreased balance;Decreased activity tolerance;Decreased mobility;Decreased knowledge of use of DME;Pain PT Treatment Interventions: Gait training;DME instruction;Functional mobility training;Therapeutic activities;Therapeutic exercise;Balance training;Patient/family education   PT Goals Acute Rehab PT Goals PT Goal Formulation: With patient Time For Goal Achievement: 04/21/13 Potential to Achieve Goals: Good Pt will go Supine/Side to Sit: with modified independence;with HOB 0 degrees PT Goal: Supine/Side to Sit - Progress: Goal set today Pt will go Sit to Supine/Side: with modified independence;with HOB 0 degrees PT Goal: Sit to Supine/Side - Progress: Goal set today Pt will go Sit to Stand: with upper extremity assist;with modified independence PT Goal: Sit to Stand - Progress: Goal set today Pt will go Stand to Sit: with upper extremity assist;with modified independence PT Goal: Stand to Sit - Progress: Goal set today Pt will Ambulate: 51 - 150 feet;with min assist;with rolling walker PT Goal: Ambulate - Progress: Goal set today Pt will Perform Home Exercise Program: with supervision, verbal cues required/provided PT Goal: Perform Home Exercise Program - Progress: Goal set today  Visit Information  Last PT Received On: 04/14/13 Assistance Needed: +1    Subjective Data  Patient Stated Goal: I want to go back home   Prior Functioning  Home  Living Lives With: Alone Available Help at Discharge: Family Type  of Home: Apartment Home Access: Level entry Home Layout: One level Home Adaptive Equipment: None Prior Function Level of Independence: Independent Communication Communication: No difficulties    Cognition  Cognition Arousal/Alertness: Awake/alert Behavior During Therapy: WFL for tasks assessed/performed Overall Cognitive Status: Within Functional Limits for tasks assessed    Extremity/Trunk Assessment Right Lower Extremity Assessment RLE ROM/Strength/Tone: Deficits RLE ROM/Strength/Tone Deficits: able to ankle pump and quad set Left Lower Extremity Assessment LLE ROM/Strength/Tone: [facility] for tasks assessed Trunk Assessment Trunk Assessment: Kyphotic   Balance Balance Balance Assessed: Yes Static Sitting Balance Static Sitting - Balance Support: Bilateral upper extremity supported Static Sitting - Level of Assistance: 5: Stand by assistance Static Standing Balance Static Standing - Balance Support: Bilateral upper extremity supported Static Standing - Level of Assistance: 1: +2 Total assist Static Standing - Comment/# of Minutes: +2 assist due to posterior lean  End of Session PT - End of Session Equipment Utilized During Treatment: Gait belt Activity Tolerance: Patient limited by fatigue Patient left: in chair;with call bell/phone within reach;with family/visitor present Nurse Communication: Mobility status  GP     Olivia Canter, [place] [phone] 04/14/2013, 11:12 AM

## 2013-04-14 NOTE — Progress Notes (Signed)
Orthopedic Tech Progress Note Patient Details:  Rita Chapman [birth year] [id number] Patient family refused ohf.     [name] 04/14/2013, 4:20 PM

## 2013-04-14 NOTE — Progress Notes (Signed)
[facility] PROGRESS NOTE  Tasheema Perrone ZOX:[id number] DOB: 07/09/1926 DOA: 04/12/2013 PCP: Doreen Salvage, MD  Assessment/Plan: 1. Mechanical Fall: with right femoral fracture. S/p right hemi arthroplasty. Pain control and minimal sedation and PT eval recommended CIR, but daughter wants to take her to [facility]. csw consulted .  2. Hypertension: optimally controlled.  3. GERD; protonix.  4. Anemia: normocytic. Possibly from AOBL. Continue to monitor. Will transfuse if H&h drops to less than 8.  5. Hyponatremia: possibly from dehydration. STOP Hyponatremic fluids and start her on normal saline at 75/hr.   DVT prophylaxis.  Code Status: DNR Family Communication: none at bedside. Disposition Plan: pending PT eval after surgery.    Consultants:  Ortho pedics  cardiology  Procedures:  Right hemi arthroplasty on 5/30  Antibiotics: 1 dose of cefazolin  HPI/Subjective: Very pleasant.   Objective: Filed Vitals:   04/14/13 0653 04/14/13 0800 04/14/13 1200 04/14/13 1345  BP: 125/86   115/45  Pulse: 82   72  Temp: 97.1 F (36.2 C)   98.6 F (37 C)  TempSrc:      Resp: 20 16 16 16   Height:      Weight:      SpO2: 99% 99% 99% 100%    Intake/Output Summary (Last 24 hours) at 04/14/13 1656 Last data filed at 04/14/13 1348  Gross per 24 hour  Intake   2500 ml  Output    600 ml  Net   1900 ml   Filed Weights   04/12/13 0303  Weight: 46.267 kg (102 lb)    Exam:   Alert afebrile comfortable.  CVS: S1S2 rrr  LUngs; decreased air entry at bases.   Abdomen : soft NT ND BS+  Extremities: pain ROM.   Data Reviewed: Basic Metabolic Panel:  Recent Labs Lab 04/12/13 0345 04/13/13 0615 04/14/13 0530  NA 138 131* 128*  K 3.5 3.6 4.1  CL 98 95* 93*  CO2 25 28 28   GLUCOSE 130* 151* 124*  BUN 16 13 16   CREATININE 0.60 0.58 0.62  CALCIUM 10.4 9.4 8.7   Liver Function Tests:  Recent Labs Lab 04/12/13 0345 04/13/13 0615  AST 33 19  ALT 26 14   ALKPHOS 89 51  BILITOT 0.6 0.5  PROT 7.9 5.9*  ALBUMIN 4.5 3.2*   No results found for this basename: LIPASE, AMYLASE,  in the last 168 hours No results found for this basename: AMMONIA,  in the last 168 hours CBC:  Recent Labs Lab 04/12/13 0345 04/13/13 0615 04/14/13 0530  WBC 12.9* 6.6 6.9  HGB 14.2 11.6* 10.1*  HCT 40.4 33.0* 28.7*  MCV 87.4 87.5 85.2  PLT 149* 107* 122*   Cardiac Enzymes: No results found for this basename: CKTOTAL, CKMB, CKMBINDEX, TROPONINI,  in the last 168 hours BNP (last 3 results) No results found for this basename: PROBNP,  in the last 8760 hours CBG: No results found for this basename: GLUCAP,  in the last 168 hours  No results found for this or any previous visit (from the past 240 hour(s)).   Studies: Dg Pelvis Portable  04/13/2013   *RADIOLOGY REPORT*  Clinical Data: Postop right hip.  PORTABLE PELVIS  Comparison: 04/12/2013  Findings: Changes of right hip replacement.  No hardware or bony complicating feature.  Mild osteopenia.  IMPRESSION: Right hip replacement.  No complicating feature.   Original Report Authenticated By: Charlett Nose, M.D.   Dg Hip Portable 1 View Right  04/13/2013   *RADIOLOGY REPORT*  Clinical  Data: Postop right hip.  PORTABLE RIGHT HIP - 1 VIEW  Comparison: 04/12/2013  Findings: Cross-table lateral demonstrates changes of right hip replacement.  Normal alignment.  No hardware bony complicating feature.  IMPRESSION: Right hip replacement.  No visible complicating feature.   Original Report Authenticated By: Charlett Nose, M.D.    Scheduled Meds: . amLODipine  5 mg Oral Daily  . aspirin EC  81 mg Oral Daily  . docusate sodium  100 mg Oral BID  . enoxaparin (LOVENOX) injection  40 mg Subcutaneous Q24H  . famotidine  10 mg Oral QHS  . hydrochlorothiazide  25 mg Oral Daily  . LORazepam  0.5 mg Oral BID  . losartan  50 mg Oral Daily  . senna  1 tablet Oral BID  . sertraline  25 mg Oral Daily  . simvastatin  20 mg Oral  QPM   Continuous Infusions:    Principal Problem:   Fracture of femoral neck, right Active Problems:   Hypertension   Hypercholesterolemia   Osteoporosis   Pre-operative cardiovascular examination    Time spent: 25 minutes.    [facility]  [facility] Pager [phone]. If 7PM-7AM, please contact night-coverage at [email or web address], password [facility] 04/14/2013, 4:56 PM  LOS: 2 days

## 2013-04-14 NOTE — Evaluation (Addendum)
Occupational Therapy Evaluation Patient Details Name: Rita Chapman MRN: [id number] DOB: [birth year] Today's Date: 04/14/2013 Time: 0865-7846 OT Time Calculation (min): 30 min  OT Assessment / Plan / Recommendation Clinical Impression    Patient s/p fall and now Hip hemiarthoplasty. Patient was very independent prior to admission and is motivated to return home alone. Pt presents with below problem list. WIll benefit from OT to increase independence for eventual d/c to home. Agree patient will need inpatient post-acute rehab prior to returning home.      OT Assessment  Patient needs continued OT Services    Follow Up Recommendations  CIR    Barriers to Discharge      Equipment Recommendations  Other (comment) (tbd)    Recommendations for Other Services    Frequency  Min 2X/week    Precautions / Restrictions Precautions Precautions: Posterior Hip Precaution Comments: Educated pt on precautions. Restrictions Weight Bearing Restrictions: Yes RLE Weight Bearing: Weight bearing as tolerated   Pertinent Vitals/Pain Pt reported pain with mobilization, but did not rate.     ADL  Eating/Feeding: Independent Where Assessed - Eating/Feeding: Chair Grooming: Set up Where Assessed - Grooming: Supported sitting Upper Body Bathing: Minimal assistance Where Assessed - Upper Body Bathing: Supported sitting Lower Body Bathing: Maximal assistance Where Assessed - Lower Body Bathing: Supported sit to stand Upper Body Dressing: Minimal assistance Where Assessed - Upper Body Dressing: Supported sitting Lower Body Dressing: Maximal assistance Where Assessed - Lower Body Dressing: Supported sit to Pharmacist, hospital: Teacher, early years/pre: Patient Percentage: 50% Statistician Method: Sit to Barista: Raised toilet seat with arms (or 3-in-1 over toilet) Toileting - Clothing Manipulation and Hygiene: +1 Total assistance Where Assessed -  Glass blower/designer Manipulation and Hygiene: Sit on 3-in-1 or toilet;Standing Tub/Shower Transfer Method: Not assessed Equipment Used: Gait belt;Rolling walker Transfers/Ambulation Related to ADLs: +2 Total A ADL Comments: Pt at overall Max A/Total A for LB ADLs with sit to stand transfer.  Pt transferred to 3 in1, but unable to void. Cues for pt to maintain hip precautions with bed mobility and transfers.    OT Diagnosis: Acute pain  OT Problem List: Decreased strength;Decreased range of motion;Decreased activity tolerance;Impaired balance (sitting and/or standing);Decreased knowledge of use of DME or AE;Decreased knowledge of precautions;Pain OT Treatment Interventions: Self-care/ADL training;DME and/or AE instruction;Therapeutic activities;Patient/family education;Balance training   OT Goals Acute Rehab OT Goals OT Goal Formulation: With patient Time For Goal Achievement: 04/21/13 Potential to Achieve Goals: Good ADL Goals Pt Will Perform Upper Body Bathing: with modified independence;Sitting, chair ADL Goal: Upper Body Bathing - Progress: Goal set today Pt Will Perform Lower Body Bathing: with supervision;Sit to stand from chair ADL Goal: Lower Body Bathing - Progress: Goal set today Pt Will Perform Upper Body Dressing: with modified independence;Sitting, chair;Sitting, bed ADL Goal: Upper Body Dressing - Progress: Goal set today Pt Will Perform Lower Body Dressing: with supervision;Sit to stand from bed;Sit to stand from chair ADL Goal: Lower Body Dressing - Progress: Goal set today Pt Will Transfer to Toilet: with modified independence;Ambulation;with DME ADL Goal: Toilet Transfer - Progress: Goal set today Pt Will Perform Toileting - Clothing Manipulation: with supervision;Standing ADL Goal: Toileting - Clothing Manipulation - Progress: Goal set today Pt Will Perform Toileting - Hygiene: with supervision;Sit to stand from 3-in-1/toilet;Sitting on 3-in-1 or toilet ADL Goal:  Toileting - Hygiene - Progress: Goal set today  Visit Information  Last OT Received On: 04/14/13 Assistance Needed: +1    Subjective  Data      Prior Functioning     Home Living Lives With: Alone Available Help at Discharge: Family Type of Home: Apartment Home Access: Level entry Home Layout: One level Bathroom Shower/Tub: Health visitor:  (family unsure) Home Adaptive Equipment: Grab bars around toilet Prior Function Level of Independence: Independent Communication Communication: No difficulties         Vision/Perception     Cognition  Cognition Arousal/Alertness: Awake/alert Behavior During Therapy: WFL for tasks assessed/performed Overall Cognitive Status: Within Functional Limits for tasks assessed    Extremity/Trunk Assessment Right Upper Extremity Assessment RUE ROM/Strength/Tone: Deficits RUE ROM/Strength/Tone Deficits: decreased ROM with shoulder flexion- approx 110-120 degrees grossly assessed Left Upper Extremity Assessment LUE ROM/Strength/Tone: Deficits LUE ROM/Strength/Tone Deficits: Decreased ROM with shoulder flexion-approx 90 degrees-grossly assessed     Mobility Bed Mobility Bed Mobility: Sit to Supine Sit to Supine: 3: Mod assist Details for Bed Mobility Assistance: Assistance to lift both LE's onto bed and also assist with shifting hips in bed. cues for sequencing. Transfers Transfers: Sit to Stand;Stand to Sit Sit to Stand: 1: +2 Total assist;From chair/3-in-1 Sit to Stand: Patient Percentage: 50% Stand to Sit: 1: +2 Total assist;To bed;To chair/3-in-1 Stand to Sit: Patient Percentage: 50% Details for Transfer Assistance: Pt with significant posterior lean while standing. Cues for positioning of RLE when sitting/standing.       Balance   End of Session OT - End of Session Equipment Utilized During Treatment: Gait belt Activity Tolerance: Patient limited by fatigue Patient left: in bed;with family/visitor present   GO     Earlie Raveling  OTR/L [phone]  04/14/2013, 2:03 PM

## 2013-04-14 NOTE — Progress Notes (Signed)
SPORTS MEDICINE AND JOINT REPLACEMENT  Georgena Spurling, MD   Altamese Cabal, PA-C [address] [place], [place], [place]  [address]                             [phone]   PROGRESS NOTE  Subjective:  negative for Chest Pain  negative for Shortness of Breath  negative for Nausea/Vomiting   negative for Calf Pain  negative for Bowel Movement   Tolerating Diet: yes         Patient reports pain as 3 on 0-10 scale.    Objective: Vital signs in last 24 hours:   Patient Vitals for the past 24 hrs:  BP Temp Pulse Resp SpO2  04/14/13 0653 125/86 mmHg 97.1 F (36.2 C) 82 20 99 %  04/13/13 2110 135/53 mmHg 97.8 F (36.6 C) 90 28 99 %  04/13/13 2100 - - 87 13 96 %  04/13/13 2055 142/56 mmHg - 86 19 96 %  04/13/13 2040 151/46 mmHg - 87 19 98 %  04/13/13 2025 154/49 mmHg - 89 15 95 %  04/13/13 2010 132/72 mmHg 98.2 F (36.8 C) 95 23 98 %  04/13/13 1055 149/60 mmHg - - - -    @flow {1959:LAST@   Intake/Output from previous day:   05/30 0701 - 05/31 0700 In: 2020 [P.O.:120; I.V.:1900] Out: 600 [Urine:500]   Intake/Output this shift:       Intake/Output     05/30 0701 - 05/31 0700 05/31 0701 - 06/01 0700   P.O. 120    I.V. (mL/kg) 1900 (41.1)    Total Intake(mL/kg) 2020 (43.7)    Urine (mL/kg/hr) 500 (0.5)    Blood 100 (0.1)    Total Output 600     Net +1420             LABORATORY DATA:  Recent Labs  04/12/13 0345 04/13/13 0615 04/14/13 0530  WBC 12.9* 6.6 6.9  HGB 14.2 11.6* 10.1*  HCT 40.4 33.0* 28.7*  PLT 149* 107* 122*    Recent Labs  04/12/13 0345 04/13/13 0615 04/14/13 0530  NA 138 131* 128*  K 3.5 3.6 4.1  CL 98 95* 93*  CO2 25 28 28   BUN 16 13 16   CREATININE 0.60 0.58 0.62  GLUCOSE 130* 151* 124*  CALCIUM 10.4 9.4 8.7   Lab Results  Component Value Date   INR 1.04 04/12/2013    Examination:  General appearance: alert, cooperative and no distress Extremities: extremities normal, atraumatic, no cyanosis or edema and Homans sign is  negative, no sign of DVT  Wound Exam: clean, dry, intact   Drainage:  None: wound tissue dry  Motor Exam: EHL and FHL Intact  Sensory Exam: Deep Peroneal normal   Assessment:    1 Day Post-Op  Procedure(s) (LRB): RIGHT HIP ARTHROPLASTY BIPOLAR  (Right)  ADDITIONAL DIAGNOSIS:  Principal Problem:   Fracture of femoral neck, right Active Problems:   Hypertension   Hypercholesterolemia   Osteoporosis   Pre-operative cardiovascular examination  Acute Blood Loss Anemia   Plan: Physical Therapy as ordered Weight Bearing as Tolerated (WBAT)  DVT Prophylaxis:  Lovenox  DISCHARGE PLAN: Skilled Nursing Facility/Rehab  DISCHARGE NEEDS: Walker and 3-in-1 comode seat         Marshun Duva 04/14/2013, 9:41 AM

## 2013-04-15 LAB — BASIC METABOLIC PANEL
BUN: 15 mg/dL (ref 6–23)
Chloride: 91 mEq/L — ABNORMAL LOW (ref 96–112)
Creatinine, Ser: 0.61 mg/dL (ref 0.50–1.10)
GFR calc Af Amer: >90 mL/min (ref >90)
GFR calc non Af Amer: 81 mL/min — ABNORMAL LOW (ref >90)

## 2013-04-15 LAB — CBC
HCT: 20.7 % — ABNORMAL LOW (ref 36.0–46.0)
MCHC: 36.7 g/dL — ABNORMAL HIGH (ref 30.0–36.0)
RDW: 12.2 % (ref 11.5–15.5)

## 2013-04-15 LAB — TYPE AND SCREEN: ABO/RH(D): B POS

## 2013-04-15 MED ORDER — PNEUMOCOCCAL VAC POLYVALENT 25 MCG/0.5ML IJ INJ
0.5000 mL | INJECTION | INTRAMUSCULAR | Status: AC
  Administered 2013-04-16: 0.5 mL via INTRAMUSCULAR
  Filled 2013-04-15: qty 0.5

## 2013-04-15 NOTE — Progress Notes (Signed)
Physical Therapy Treatment Patient Details Name: Rita Chapman MRN: [id number] DOB: [birth year] Today's Date: 04/15/2013 Time: 4098-1191 PT Time Calculation (min): 28 min  PT Assessment / Plan / Recommendation Comments on Treatment Session  s/p hip hemiarthroplasty; Making improvements in ambulation distance and activity tolerance; Still requiring phsyical assist/support for balance; Agree with postacute inpt rehab; Pt's daughter requesting [facility]    Follow Up Recommendations  CIR     Does the patient have the potential to tolerate intense rehabilitation     Barriers to Discharge        Equipment Recommendations  Rolling walker with 5" wheels    Recommendations for Other Services    Frequency Min 5X/week   Plan Discharge plan remains appropriate    Precautions / Restrictions Precautions Precautions: Posterior Hip Precaution Comments: Educated pt on precautions. Restrictions RLE Weight Bearing: Weight bearing as tolerated   Pertinent Vitals/Pain Grimace with moving; Did not rate; positioned as comfortably as possible in bed    Mobility  Bed Mobility Bed Mobility: Sit to Supine Sitting - Scoot to Edge of Bed: 4: Min assist Sit to Supine: 3: Mod assist Details for Bed Mobility Assistance: Assistance to lift both LE's onto bed and also assist with shifting hips in bed. cues for sequencing. Transfers Transfers: Sit to Stand;Stand to Sit Sit to Stand: 1: +2 Total assist;From chair/3-in-1 Sit to Stand: Patient Percentage: 60% Stand to Sit: 3: Mod assist Details for Transfer Assistance: continued posterior lean, though seems to be improving Ambulation/Gait Ambulation/Gait Assistance: 1: +2 Total assist Ambulation/Gait: Patient Percentage: 60% Ambulation Distance (Feet): 18 Feet Assistive device: Rolling walker Ambulation/Gait Assistance Details: Requiring step-by-step cues for sequence; Tends to step Right foot prematurely into RW; Cues for optimal positioning in RW, however pt  impulsivley stepping too far into RW and therefore her weight shifts posteriorly    Exercises     PT Diagnosis:    PT Problem List:   PT Treatment Interventions:     PT Goals Acute Rehab PT Goals Time For Goal Achievement: 04/21/13 Potential to Achieve Goals: Good Pt will go Supine/Side to Sit: with modified independence;with HOB 0 degrees Pt will go Sit to Supine/Side: with modified independence;with HOB 0 degrees PT Goal: Sit to Supine/Side - Progress: Progressing toward goal Pt will go Sit to Stand: with upper extremity assist;with modified independence PT Goal: Sit to Stand - Progress: Progressing toward goal Pt will go Stand to Sit: with upper extremity assist;with modified independence PT Goal: Stand to Sit - Progress: Progressing toward goal Pt will Ambulate: 51 - 150 feet;with min assist;with rolling walker PT Goal: Ambulate - Progress: Progressing toward goal  Visit Information  Last PT Received On: 04/15/13 Assistance Needed: +1 (+2 helpful)    Subjective Data  Subjective: Agreeable to back to bad   Cognition  Cognition Arousal/Alertness: Awake/alert Behavior During Therapy: Impulsive Overall Cognitive Status: Within Functional Limits for tasks assessed    Balance     End of Session PT - End of Session Equipment Utilized During Treatment: Gait belt Activity Tolerance: Patient tolerated treatment well Patient left: in bed;with call bell/phone within reach;with family/visitor present Nurse Communication: Mobility status   GP     Rita Chapman Rita Chapman, Rita Chapman [phone]  04/15/2013, 5:13 PM

## 2013-04-15 NOTE — Progress Notes (Signed)
SPORTS MEDICINE AND JOINT REPLACEMENT  Georgena Spurling, MD   Altamese Cabal, PA-C [address] [place], [place], [place]  [address]                             [phone]   PROGRESS NOTE  Subjective:  negative for Chest Pain  negative for Shortness of Breath  negative for Nausea/Vomiting   negative for Calf Pain  negative for Bowel Movement   Tolerating Diet: yes         Patient reports pain as 4 on 0-10 scale.    Objective: Vital signs in last 24 hours:   Patient Vitals for the past 24 hrs:  BP Temp Temp src Pulse Resp SpO2  04/15/13 0621 130/43 mmHg 98.5 F (36.9 C) Oral 88 16 92 %  04/14/13 2151 155/45 mmHg 98.8 F (37.1 C) - 82 16 98 %  04/14/13 1345 115/45 mmHg 98.6 F (37 C) - 72 16 100 %  04/14/13 1200 - - - - 16 99 %    @flow {1959:LAST@   Intake/Output from previous day:   05/31 0701 - 06/01 0700 In: 1320 [P.O.:1320] Out: 850 [Urine:850]   Intake/Output this shift:   06/01 0701 - 06/01 1900 In: 600 [P.O.:600] Out: -    Intake/Output     05/31 0701 - 06/01 0700 06/01 0701 - 06/02 0700   P.O. 1320 600   I.V. (mL/kg)     Total Intake(mL/kg) 1320 (28.5) 600 (13)   Urine (mL/kg/hr) 850 (0.8)    Blood     Total Output 850     Net +470 +600        Urine Occurrence  1 x   Stool Occurrence  1 x      LABORATORY DATA:  Recent Labs  04/12/13 0345 04/13/13 0615 04/14/13 0530 04/15/13 0715  WBC 12.9* 6.6 6.9 4.1  HGB 14.2 11.6* 10.1* 7.6*  HCT 40.4 33.0* 28.7* 20.7*  PLT 149* 107* 122* 98*    Recent Labs  04/12/13 0345 04/13/13 0615 04/14/13 0530 04/15/13 0454  NA 138 131* 128* 124*  K 3.5 3.6 4.1 3.4*  CL 98 95* 93* 91*  CO2 25 28 28 30   BUN 16 13 16 15   CREATININE 0.60 0.58 0.62 0.61  GLUCOSE 130* 151* 124* 118*  CALCIUM 10.4 9.4 8.7 8.3*   Lab Results  Component Value Date   INR 1.04 04/12/2013    Examination:  General appearance: alert, cooperative and no distress Extremities: Homans sign is negative, no sign of DVT  Wound  Exam: clean, dry, intact   Drainage:  None: wound tissue dry  Motor Exam: EHL and FHL Intact  Sensory Exam: Deep Peroneal normal   Assessment:    2 Days Post-Op  Procedure(s) (LRB): RIGHT HIP ARTHROPLASTY BIPOLAR  (Right)  ADDITIONAL DIAGNOSIS:  Principal Problem:   Fracture of femoral neck, right Active Problems:   Hypertension   Hypercholesterolemia   Osteoporosis   Pre-operative cardiovascular examination  Acute Blood Loss Anemia   Plan: Physical Therapy as ordered Weight Bearing as Tolerated (WBAT)  DISCHARGE PLAN: Skilled Nursing Facility/Rehab  DISCHARGE NEEDS: Walker and 3-in-1 comode seat         Rita Chapman 04/15/2013, 10:25 AM

## 2013-04-15 NOTE — Clinical Social Work Psychosocial (Signed)
     Clinical Social Work Department BRIEF PSYCHOSOCIAL ASSESSMENT 04/15/2013  Patient:  Rita Chapman, Rita Chapman     Account Number:  [id number]     Admit date:  04/12/2013  Clinical Social Worker:  Hulan Fray  Date/Time:  04/15/2013 11:01 AM  Referred by:  Physician  Date Referred:  04/13/2013 Referred for  SNF Placement   Other Referral:   Interview type:  Patient Other interview type:   daughterClarisa Chapman at bedside [phone])    PSYCHOSOCIAL DATA Living Status:  ALONE Admitted from facility:   Level of care:   Primary support name:  Rita Chapman Primary support relationship to patient:  CHILD, ADULT Degree of support available:   supportive    CURRENT CONCERNS Current Concerns  Post-Acute Placement   Other Concerns:    SOCIAL WORK ASSESSMENT / PLAN Clinical Social Worker received a referral for patient needing short term SNF placement at discharge. CSW introduced self and explained reason for visit. Patient's daughter was at bedside. CSW explained SNF process and answered daughter's questions. Daughter reported that she would prefer [facility] if they have availability. CSW encouraged daughter to have a second choice facility in mind as well. CSW will inintiate SNF search in [facility]    CSW will complete FL2 for MD's signature and will update patient and family when bed offers are received.   Assessment/plan status:  Psychosocial Support/Ongoing Assessment of Needs Other assessment/ plan:   Information/referral to community resources:   SNF packet    PATIENTS/FAMILYS RESPONSE TO PLAN OF CARE: Patient and daughter are agreeable for CSW to intiate SNF search in [place], with preference for [facility]	[place]. Patient and daughter were appreciative of CSW's visit and assistance with discharge planning.

## 2013-04-15 NOTE — Progress Notes (Signed)
[facility] PROGRESS NOTE  Rita Chapman ZOX:[id number] DOB: [birth year] DOA: 04/12/2013 PCP: Doreen Salvage, MD  Assessment/Plan: 1. Mechanical Fall: with right femoral fracture. S/p right hemi arthroplasty. Pain control and minimal sedation and PT eval recommended CIR, but daughter wants to take her to [facility]. csw consulted .  2. Hypertension: optimally controlled.  3. GERD; protonix.  4. Anemia: normocytic. Possibly from AOBL. Continue to monitor. Will transfuse if H&h drops to less than 8.  We will go ahead and transfuse 2 units of prbc today and repeat H&H today after the transfusion.  5. Hyponatremia: worsened after changing to normal saline fluids. Will start work up for SIADH. Her tsh is within normal limits.  DVT prophylaxis.  Code Status: DNR Family Communication: none at bedside. Disposition Plan: SNF.    Consultants:  Ortho pedics  cardiology  Procedures:  Right hemi arthroplasty on 5/30  Antibiotics: 1 dose of cefazolin  HPI/Subjective: Very pleasant.   Objective: Filed Vitals:   04/14/13 2151 04/15/13 0621 04/15/13 1200 04/15/13 1418  BP: 155/45 130/43  130/69  Pulse: 82 88  109  Temp: 98.8 F (37.1 C) 98.5 F (36.9 C)  99 F (37.2 C)  TempSrc:  Oral    Resp: 16 16 16    Height:      Weight:      SpO2: 98% 92% 94% 94%    Intake/Output Summary (Last 24 hours) at 04/15/13 1658 Last data filed at 04/15/13 0920  Gross per 24 hour  Intake   1080 ml  Output    850 ml  Net    230 ml   Filed Weights   04/12/13 0303  Weight: 46.267 kg (102 lb)    Exam:   Alert afebrile comfortable.  CVS: S1S2 rrr  LUngs; decreased air entry at bases.   Abdomen : soft NT ND BS+  Extremities: pain ROM.   Data Reviewed: Basic Metabolic Panel:  Recent Labs Lab 04/12/13 0345 04/13/13 0615 04/14/13 0530 04/15/13 0454  NA 138 131* 128* 124*  K 3.5 3.6 4.1 3.4*  CL 98 95* 93* 91*  CO2 25 28 28 30   GLUCOSE 130* 151* 124* 118*  BUN 16 13 16 15    CREATININE 0.60 0.58 0.62 0.61  CALCIUM 10.4 9.4 8.7 8.3*   Liver Function Tests:  Recent Labs Lab 04/12/13 0345 04/13/13 0615  AST 33 19  ALT 26 14  ALKPHOS 89 51  BILITOT 0.6 0.5  PROT 7.9 5.9*  ALBUMIN 4.5 3.2*   No results found for this basename: LIPASE, AMYLASE,  in the last 168 hours No results found for this basename: AMMONIA,  in the last 168 hours CBC:  Recent Labs Lab 04/12/13 0345 04/13/13 0615 04/14/13 0530 04/15/13 0715  WBC 12.9* 6.6 6.9 4.1  HGB 14.2 11.6* 10.1* 7.6*  HCT 40.4 33.0* 28.7* 20.7*  MCV 87.4 87.5 85.2 84.1  PLT 149* 107* 122* 98*   Cardiac Enzymes: No results found for this basename: CKTOTAL, CKMB, CKMBINDEX, TROPONINI,  in the last 168 hours BNP (last 3 results) No results found for this basename: PROBNP,  in the last 8760 hours CBG: No results found for this basename: GLUCAP,  in the last 168 hours  No results found for this or any previous visit (from the past 240 hour(s)).   Studies: Dg Pelvis Portable  04/13/2013   *RADIOLOGY REPORT*  Clinical Data: Postop right hip.  PORTABLE PELVIS  Comparison: 04/12/2013  Findings: Changes of right hip replacement.  No hardware or  bony complicating feature.  Mild osteopenia.  IMPRESSION: Right hip replacement.  No complicating feature.   Original Report Authenticated By: Charlett Nose, M.D.   Dg Hip Portable 1 View Right  04/13/2013   *RADIOLOGY REPORT*  Clinical Data: Postop right hip.  PORTABLE RIGHT HIP - 1 VIEW  Comparison: 04/12/2013  Findings: Cross-table lateral demonstrates changes of right hip replacement.  Normal alignment.  No hardware bony complicating feature.  IMPRESSION: Right hip replacement.  No visible complicating feature.   Original Report Authenticated By: Charlett Nose, M.D.    Scheduled Meds: . amLODipine  5 mg Oral Daily  . aspirin EC  81 mg Oral Daily  . docusate sodium  100 mg Oral BID  . enoxaparin (LOVENOX) injection  40 mg Subcutaneous Q24H  . famotidine  10 mg Oral  QHS  . hydrochlorothiazide  25 mg Oral Daily  . LORazepam  0.5 mg Oral BID  . losartan  50 mg Oral Daily  . [START ON 04/16/2013] pneumococcal 23 valent vaccine  0.5 mL Intramuscular Tomorrow-1000  . senna  1 tablet Oral BID  . sertraline  25 mg Oral Daily  . simvastatin  20 mg Oral QPM   Continuous Infusions:    Principal Problem:   Fracture of femoral neck, right Active Problems:   Hypertension   Hypercholesterolemia   Osteoporosis   Pre-operative cardiovascular examination    Time spent: 25 minutes.    [facility]  [facility] Pager [phone]. If 7PM-7AM, please contact night-coverage at [email or web address], password [facility] 04/15/2013, 4:58 PM  LOS: 3 days

## 2013-04-15 NOTE — Clinical Social Work Placement (Addendum)
    Clinical Social Work Department CLINICAL SOCIAL WORK PLACEMENT NOTE 04/15/2013  Patient:  Rita Chapman, Rita Chapman  Account Number:  [id number] Admit date:  04/12/2013  Clinical Social Worker:  Hulan Fray  Date/time:  04/15/2013 11:33 AM  Clinical Social Work is seeking post-discharge placement for this patient at the following level of care:   SKILLED NURSING   (*CSW will update this form in Epic as items are completed)   04/15/2013  Patient/family provided with [facility] Department of Clinical Social Work's list of facilities offering this level of care within the geographic area requested by the patient (or if unable, by the patient's family).  04/15/2013  Patient/family informed of their freedom to choose among providers that offer the needed level of care, that participate in Medicare, Medicaid or managed care program needed by the patient, have an available bed and are willing to accept the patient.  04/15/2013  Patient/family informed of [facility]' ownership interest in [facility], as well as of the fact that they are under no obligation to receive care at this facility.  PASARR submitted to EDS on 04/15/2013 PASARR number received from EDS on   FL2 transmitted to all facilities in geographic area requested by pt/family on  04/15/2013 FL2 transmitted to all facilities within larger geographic area on   Patient informed that his/her managed care company has contracts with or will negotiate with  certain facilities, including the following:     Patient/family informed of bed offers received:  04/16/13 Patient chooses bed at [facility] Physician recommends and patient chooses bed at    Patient to be transferred to  On 04/17/2013   Patient to be transferred to facility by [facility]  The following physician request were entered in Epic:   Additional Comments:

## 2013-04-16 ENCOUNTER — Inpatient Hospital Stay (HOSPITAL_COMMUNITY): Payer: Medicare Other

## 2013-04-16 DIAGNOSIS — R509 Fever, unspecified: Secondary | ICD-10-CM | POA: Diagnosis present

## 2013-04-16 DIAGNOSIS — E871 Hypo-osmolality and hyponatremia: Secondary | ICD-10-CM

## 2013-04-16 DIAGNOSIS — D62 Acute posthemorrhagic anemia: Secondary | ICD-10-CM | POA: Diagnosis present

## 2013-04-16 HISTORY — DX: Acute posthemorrhagic anemia: D62

## 2013-04-16 HISTORY — DX: Fever, unspecified: R50.9

## 2013-04-16 HISTORY — DX: Hypo-osmolality and hyponatremia: E87.1

## 2013-04-16 LAB — CBC
HCT: 27.6 % — ABNORMAL LOW (ref 36.0–46.0)
Hemoglobin: 10.1 g/dL — ABNORMAL LOW (ref 12.0–15.0)
MCV: 83.1 fL (ref 78.0–100.0)
RDW: 12.8 % (ref 11.5–15.5)
WBC: 4.6 10*3/uL (ref 4.0–10.5)

## 2013-04-16 LAB — TYPE AND SCREEN
ABO/RH(D): B POS
Unit division: 0

## 2013-04-16 LAB — BASIC METABOLIC PANEL
Calcium: 8.4 mg/dL (ref 8.4–10.5)
Creatinine, Ser: 0.51 mg/dL (ref 0.50–1.10)
GFR calc non Af Amer: 86 mL/min — ABNORMAL LOW (ref >90)
Sodium: 127 mEq/L — ABNORMAL LOW (ref 135–145)

## 2013-04-16 NOTE — Progress Notes (Signed)
Rehab Admissions Coordinator Note:  Patient was screened by Clois Dupes for appropriateness for an Inpatient Acute Rehab Consult.  At this time, we are recommending Skilled Nursing Facility for daughter is requesting [facility] at this time. I have alerted SW and RN CM.  Clois Dupes 04/16/2013, 8:33 AM  I can be reached at [phone].

## 2013-04-16 NOTE — Progress Notes (Signed)
     Subjective:  Patient reports pain as mild.  No complaints.  Objective:   VITALS:   Filed Vitals:   04/15/13 2315 04/16/13 0015 04/16/13 0030 04/16/13 0707  BP: 136/40 112/32 114/41 136/48  Pulse: 65 79 70 71  Temp: 99.8 F (37.7 C) 98.3 F (36.8 C) 98 F (36.7 C) 98.2 F (36.8 C)  TempSrc: Oral Oral Oral Oral  Resp: 15 16 15 16   Height:      Weight:      SpO2:    99%    Neurologically intact Dorsiflexion/Plantar flexion intact Incision: no drainage   Lab Results  Component Value Date   WBC 4.6 04/16/2013   HGB 10.1* 04/16/2013   HCT 27.6* 04/16/2013   MCV 83.1 04/16/2013   PLT 96* 04/16/2013     Assessment/Plan: 3 Days Post-Op   Principal Problem:   Fracture of femoral neck, right Active Problems:   Hypertension   Hypercholesterolemia   Osteoporosis   Pre-operative cardiovascular examination   Advance diet Up with therapy Discharge to SNF, patient has requested collapse, okay for discharge from my standpoint. Lovenox for 3 weeks, weightbearing as tolerated, prescriptions prepared.   Christyna Letendre P 04/16/2013, 8:26 AM   Teryl Lucy, MD Cell [phone]

## 2013-04-16 NOTE — Progress Notes (Signed)
[facility] PROGRESS NOTE  Rita Chapman WUJ:[id number] DOB: 06-06-1926 DOA: 04/12/2013 PCP: Doreen Salvage, MD  Assessment/Plan: 1. Mechanical Fall: with right femoral fracture. S/p right hemi arthroplasty. Pain control and minimal sedation and PT eval recommended CIR, but daughter wants to take her to [facility]. csw consulted .  2. Hypertension: optimally controlled.  3. GERD; protonix.  4. Anemia: normocytic. Possibly from AOBL. Continue to monitor. Will transfuse if H&h drops to less than 8.  She underwent transfusion of 2 units of prbc yesterday and her H&H improved.  5. Hyponatremia: worsened after changing to normal saline fluids. appears to be siadh, stopped the fluds, serum osmo is 261, and improved sodium today.  6. Fever: CXR ordered. No leukocytosis. Suspecting it possibly from blood transfusions.  DVT prophylaxis.  Code Status: DNR Family Communication: none at bedside. Disposition Plan: SNF tomorrow,     Consultants:  Ortho pedics  cardiology  Procedures:  Right hemi arthroplasty on 5/30  Antibiotics: 1 dose of cefazolin  HPI/Subjective: Very pleasant.   Objective: Filed Vitals:   04/15/13 2315 04/16/13 0015 04/16/13 0030 04/16/13 0707  BP: 136/40 112/32 114/41 136/48  Pulse: 65 79 70 71  Temp: 99.8 F (37.7 C) 98.3 F (36.8 C) 98 F (36.7 C) 98.2 F (36.8 C)  TempSrc: Oral Oral Oral Oral  Resp: 15 16 15 16   Height:      Weight:      SpO2:    99%    Intake/Output Summary (Last 24 hours) at 04/16/13 1356 Last data filed at 04/16/13 0800  Gross per 24 hour  Intake    630 ml  Output      1 ml  Net    629 ml   Filed Weights   04/12/13 0303  Weight: 46.267 kg (102 lb)    Exam:   Alert afebrile comfortable.  CVS: S1S2 rrr  LUngs; decreased air entry at bases.   Abdomen : soft NT ND BS+  Extremities: pain ROM.   Data Reviewed: Basic Metabolic Panel:  Recent Labs Lab 04/12/13 0345 04/13/13 0615 04/14/13 0530 04/15/13 0454  04/16/13 0515  NA 138 131* 128* 124* 127*  K 3.5 3.6 4.1 3.4* 3.6  CL 98 95* 93* 91* 89*  CO2 25 28 28 30  32  GLUCOSE 130* 151* 124* 118* 113*  BUN 16 13 16 15 13   CREATININE 0.60 0.58 0.62 0.61 0.51  CALCIUM 10.4 9.4 8.7 8.3* 8.4   Liver Function Tests:  Recent Labs Lab 04/12/13 0345 04/13/13 0615  AST 33 19  ALT 26 14  ALKPHOS 89 51  BILITOT 0.6 0.5  PROT 7.9 5.9*  ALBUMIN 4.5 3.2*   No results found for this basename: LIPASE, AMYLASE,  in the last 168 hours No results found for this basename: AMMONIA,  in the last 168 hours CBC:  Recent Labs Lab 04/12/13 0345 04/13/13 0615 04/14/13 0530 04/15/13 0715 04/16/13 0515  WBC 12.9* 6.6 6.9 4.1 4.6  HGB 14.2 11.6* 10.1* 7.6* 10.1*  HCT 40.4 33.0* 28.7* 20.7* 27.6*  MCV 87.4 87.5 85.2 84.1 83.1  PLT 149* 107* 122* 98* 96*   Cardiac Enzymes: No results found for this basename: CKTOTAL, CKMB, CKMBINDEX, TROPONINI,  in the last 168 hours BNP (last 3 results) No results found for this basename: PROBNP,  in the last 8760 hours CBG: No results found for this basename: GLUCAP,  in the last 168 hours  No results found for this or any previous visit (from the past 240  hour(s)).   Studies: Dg Chest 2 View  04/16/2013   *RADIOLOGY REPORT*  Clinical Data: Fever.  Postop from hip fracture repair peri  CHEST - 2 VIEW  Comparison: 04/12/2013  Findings: A small left pleural effusion is seen with mild left basilar atelectasis.  Right lung is clear.  Mild cardiomegaly stable.  No evidence of congestive heart failure or pulmonary air space disease.  IMPRESSION: New small left pleural effusion and mild left basilar atelectasis.   Original Report Authenticated By: Myles Rosenthal, M.D.    Scheduled Meds: . amLODipine  5 mg Oral Daily  . docusate sodium  100 mg Oral BID  . enoxaparin (LOVENOX) injection  40 mg Subcutaneous Q24H  . famotidine  10 mg Oral QHS  . hydrochlorothiazide  25 mg Oral Daily  . LORazepam  0.5 mg Oral BID  . losartan   50 mg Oral Daily  . senna  1 tablet Oral BID  . sertraline  25 mg Oral Daily  . simvastatin  20 mg Oral QPM   Continuous Infusions:    Principal Problem:   Fracture of femoral neck, right Active Problems:   Hypertension   Hypercholesterolemia   Osteoporosis   Pre-operative cardiovascular examination    Time spent: 25 minutes.    [facility]  [facility] Pager [phone]. If 7PM-7AM, please contact night-coverage at [email or web address], password [facility] 04/16/2013, 1:56 PM  LOS: 4 days

## 2013-04-16 NOTE — Progress Notes (Signed)
Physical Therapy Treatment Patient Details Name: Rita Chapman MRN: [id number] DOB: [birth year] Today's Date: 04/16/2013 Time: 6967-8938 PT Time Calculation (min): 27 min  PT Assessment / Plan / Recommendation Comments on Treatment Session  Performed therex well with demo cues for technique; Pt seems happy to be going to postacute rehab today    Follow Up Recommendations  SNF     Does the patient have the potential to tolerate intense rehabilitation     Barriers to Discharge        Equipment Recommendations  Rolling walker with 5" wheels    Recommendations for Other Services    Frequency Min 5X/week   Plan Discharge plan remains appropriate    Precautions / Restrictions Precautions Precautions: Posterior Hip Precaution Booklet Issued: Yes (comment) Precaution Comments: Educated pt on precautions. Restrictions RLE Weight Bearing: Weight bearing as tolerated   Pertinent Vitals/Pain Some grimace with motion/therex; did not rate pain;  Repositioned as comfortably as possible in bed    Mobility       Exercises Total Joint Exercises Ankle Circles/Pumps: AROM;Both;10 reps;Supine Quad Sets: AROM;Right;10 reps Gluteal Sets: AROM;Both;10 reps Towel Squeeze: AROM;Both;10 reps Heel Slides: AAROM;Right;10 reps Hip ABduction/ADduction: AAROM;Right;10 reps Straight Leg Raises: AAROM;Right;10 reps Bridges: AROM;Both;10 reps   PT Diagnosis:    PT Problem List:   PT Treatment Interventions:     PT Goals Acute Rehab PT Goals Time For Goal Achievement: 04/21/13 Potential to Achieve Goals: Good Pt will Perform Home Exercise Program: with supervision, verbal cues required/provided PT Goal: Perform Home Exercise Program - Progress: Progressing toward goal  Visit Information  Last PT Received On: 04/16/13 Assistance Needed: +1    Subjective Data  Subjective: Agreeable to therex; [place] with many questions re: typical  therapy and progression with this particular  surgery Patient Stated Goal: I want to go back home   Cognition  Cognition Arousal/Alertness: Awake/alert Behavior During Therapy: WFL for tasks assessed/performed Overall Cognitive Status: Within Functional Limits for tasks assessed    Balance     End of Session     GP     Olen Pel [place], [place] [phone]  04/16/2013, 3:44 PM

## 2013-04-17 ENCOUNTER — Encounter (HOSPITAL_COMMUNITY): Payer: Self-pay | Admitting: Orthopedic Surgery

## 2013-04-17 DIAGNOSIS — D500 Iron deficiency anemia secondary to blood loss (chronic): Secondary | ICD-10-CM | POA: Diagnosis not present

## 2013-04-17 DIAGNOSIS — D638 Anemia in other chronic diseases classified elsewhere: Secondary | ICD-10-CM | POA: Diagnosis not present

## 2013-04-17 DIAGNOSIS — S7290XA Unspecified fracture of unspecified femur, initial encounter for closed fracture: Secondary | ICD-10-CM | POA: Diagnosis not present

## 2013-04-17 DIAGNOSIS — S72009A Fracture of unspecified part of neck of unspecified femur, initial encounter for closed fracture: Secondary | ICD-10-CM

## 2013-04-17 DIAGNOSIS — E871 Hypo-osmolality and hyponatremia: Secondary | ICD-10-CM

## 2013-04-17 DIAGNOSIS — N039 Chronic nephritic syndrome with unspecified morphologic changes: Secondary | ICD-10-CM | POA: Diagnosis not present

## 2013-04-17 DIAGNOSIS — I10 Essential (primary) hypertension: Secondary | ICD-10-CM | POA: Diagnosis not present

## 2013-04-17 DIAGNOSIS — I119 Hypertensive heart disease without heart failure: Secondary | ICD-10-CM | POA: Diagnosis not present

## 2013-04-17 DIAGNOSIS — M810 Age-related osteoporosis without current pathological fracture: Secondary | ICD-10-CM | POA: Diagnosis not present

## 2013-04-17 DIAGNOSIS — I509 Heart failure, unspecified: Secondary | ICD-10-CM | POA: Diagnosis not present

## 2013-04-17 DIAGNOSIS — E7800 Pure hypercholesterolemia, unspecified: Secondary | ICD-10-CM | POA: Diagnosis not present

## 2013-04-17 DIAGNOSIS — D62 Acute posthemorrhagic anemia: Secondary | ICD-10-CM

## 2013-04-17 DIAGNOSIS — F329 Major depressive disorder, single episode, unspecified: Secondary | ICD-10-CM | POA: Diagnosis not present

## 2013-04-17 DIAGNOSIS — R509 Fever, unspecified: Secondary | ICD-10-CM | POA: Diagnosis not present

## 2013-04-17 DIAGNOSIS — F411 Generalized anxiety disorder: Secondary | ICD-10-CM | POA: Diagnosis not present

## 2013-04-17 DIAGNOSIS — S72143A Displaced intertrochanteric fracture of unspecified femur, initial encounter for closed fracture: Secondary | ICD-10-CM | POA: Diagnosis not present

## 2013-04-17 DIAGNOSIS — S72009D Fracture of unspecified part of neck of unspecified femur, subsequent encounter for closed fracture with routine healing: Secondary | ICD-10-CM | POA: Diagnosis not present

## 2013-04-17 LAB — BASIC METABOLIC PANEL WITH GFR
BUN: 15 mg/dL (ref 6–23)
CO2: 32 meq/L (ref 19–32)
Calcium: 8.8 mg/dL (ref 8.4–10.5)
Chloride: 87 meq/L — ABNORMAL LOW (ref 96–112)
Creatinine, Ser: 0.52 mg/dL (ref 0.50–1.10)
GFR calc Af Amer: >90 mL/min
GFR calc non Af Amer: 85 mL/min — ABNORMAL LOW
Glucose, Bld: 178 mg/dL — ABNORMAL HIGH (ref 70–99)
Potassium: 3.6 meq/L (ref 3.5–5.1)
Sodium: 124 meq/L — ABNORMAL LOW (ref 135–145)

## 2013-04-17 MED ORDER — ALUM & MAG HYDROXIDE-SIMETH 200-200-20 MG/5ML PO SUSP: 30.0000 mL | ORAL | Status: DC | PRN

## 2013-04-17 MED ORDER — POLYETHYLENE GLYCOL 3350 17 G PO PACK: 17.0000 g | PACK | Freq: Every day | ORAL | Status: DC | PRN

## 2013-04-17 NOTE — Progress Notes (Signed)
Attempted to call report to [facility]. On hold for 10 + minutes. No hand off report was able to be given because RN was on hold for 10 + minutes

## 2013-04-17 NOTE — Progress Notes (Signed)
     Subjective:  Patient reports pain as mild.  Her main issue is that many people are calling her to express their support, so her phone continues to ring.  Objective:   VITALS:   Filed Vitals:   04/16/13 0707 04/16/13 1320 04/16/13 1900 04/17/13 0539  BP: 136/48 137/46 158/72 165/63  Pulse: 71 102 106 82  Temp: 98.2 F (36.8 C) 98.7 F (37.1 C) 98.2 F (36.8 C) 97.8 F (36.6 C)  TempSrc: Oral     Resp: 16 16 16 16   Height:      Weight:      SpO2: 99%  92% 99%    Neurologically intact Sensation intact distally Dorsiflexion/Plantar flexion intact Incision: scant drainage   Lab Results  Component Value Date   WBC 4.6 04/16/2013   HGB 10.1* 04/16/2013   HCT 27.6* 04/16/2013   MCV 83.1 04/16/2013   PLT 96* 04/16/2013     Assessment/Plan: 4 Days Post-Op   Principal Problem:   Fracture of femoral neck, right Active Problems:   Hypertension   Hypercholesterolemia   Osteoporosis   Pre-operative cardiovascular examination   Acute blood loss anemia   Hyposmolality and/or hyponatremia   Fever, unspecified   Advance diet Up with therapy Discharge to SNF  Lovenox for 3 weeks, prescription written. Hydrocodone for pain. Weightbearing as tolerated. Return to clinic with me in 2 weeks. This has all been entered into the computer discharge section.   Rita Chapman P 04/17/2013, 10:37 AM   Teryl Lucy, MD Cell [phone]

## 2013-04-17 NOTE — Progress Notes (Signed)
Occupational Therapy Treatment Patient Details Name: Rita Chapman MRN: [id number] DOB: 1926/09/07 Today's Date: 04/17/2013 Time: 1191-4782 OT Time Calculation (min): 45 min  OT Assessment / Plan / Recommendation Comments on Treatment Session Pt progressing towards goals. Pt performing toileting and ambulated to bathroom. Pt very concerned with having a BM. Encouraged pt to get to [facility] instead of bedpan when she has to go.Pt planning to d/c to CIR.     Follow Up Recommendations  CIR    Barriers to Discharge       Equipment Recommendations  Other (comment) (tbd)    Recommendations for Other Services    Frequency Min 2X/week   Plan Discharge plan remains appropriate    Precautions / Restrictions Precautions Precautions: Posterior Hip Precaution Comments: Educated pt on precautions. Restrictions Weight Bearing Restrictions: Yes RLE Weight Bearing: Weight bearing as tolerated   Pertinent Vitals/Pain "hurts real bad" unable to rate; RN gave pain meds  Session conducted on RA; O2 sats ranged 88-90%; restarted O2 end of session via [place] 2 liters     ADL  Grooming: Performed;Wash/dry hands;Minimal assistance Where Assessed - Grooming: Supported Copywriter, advertising: Performed;Moderate assistance Toilet Transfer Method: Sit to stand Toilet Transfer Equipment: Raised toilet seat with arms (or 3-in-1 over toilet) Toileting - Clothing Manipulation and Hygiene: +1 Total assistance Where Assessed - Toileting Clothing Manipulation and Hygiene: Standing Equipment Used: Gait belt;Rolling walker Transfers/Ambulation Related to ADLs: Ambulation +2 Total (60%). Mod A for sit to stand from 3 in 1 and +2 total (60%) from bed.  ADL Comments: Pt performed toilet transfer with much less assistance for sit to stand vs from bed. Pt requiring Total A for clothing management. Pt performed grooming at sink with Min A for balance due to posterior lean.    OT Diagnosis:    OT Problem List:   OT  Treatment Interventions:     OT Goals Acute Rehab OT Goals OT Goal Formulation: With patient Time For Goal Achievement: 04/21/13 Potential to Achieve Goals: Good ADL Goals Pt Will Perform Upper Body Bathing: with modified independence;Sitting, chair Pt Will Perform Lower Body Bathing: with supervision;Sit to stand from chair Pt Will Perform Upper Body Dressing: with modified independence;Sitting, chair;Sitting, bed Pt Will Perform Lower Body Dressing: with supervision;Sit to stand from bed;Sit to stand from chair Pt Will Transfer to Toilet: with modified independence;Ambulation;with DME ADL Goal: Toilet Transfer - Progress: Progressing toward goals Pt Will Perform Toileting - Clothing Manipulation: with supervision;Standing ADL Goal: Toileting - Clothing Manipulation - Progress: Progressing toward goals Pt Will Perform Toileting - Hygiene: with supervision;Sit to stand from 3-in-1/toilet;Sitting on 3-in-1 or toilet  Visit Information  Last OT Received On: 04/17/13 Assistance Needed: +1 PT/OT Co-Evaluation/Treatment: Yes    Subjective Data      Prior Functioning       Cognition  Cognition Arousal/Alertness: Awake/alert Behavior During Therapy: WFL for tasks assessed/performed Overall Cognitive Status: Within Functional Limits for tasks assessed    Mobility  Bed Mobility Bed Mobility: Supine to Sit;Sitting - Scoot to Edge of Bed Supine to Sit: 1: +2 Total assist Supine to Sit: Patient Percentage: 50% Sitting - Scoot to Edge of Bed: 4: Min assist Details for Bed Mobility Assistance: Required the bulk of the physical assist to elevate trunk from bed; Pt half-bridged to EOB well with small scoots and  min assist Transfers Transfers: Sit to Stand;Stand to Sit Sit to Stand: 1: +2 Total assist;From bed;3: Mod assist;From chair/3-in-1 Sit to Stand: Patient Percentage: 60% Stand to Sit:  3: Mod assist;To chair/3-in-1 Details for Transfer Assistance: Required +2 phsyical assist  from bed; Better  sit to stand from 3in1 with use of armrests; Cues for prec    Exercises      Balance     End of Session OT - End of Session Equipment Utilized During Treatment: Gait belt Activity Tolerance: Patient tolerated treatment well Patient left: in chair;with call bell/phone within reach;with family/visitor present Nurse Communication: Other (comment) (pain meds)  GO     Earlie Raveling OTR/L [phone] 04/17/2013, 11:43 AM

## 2013-04-17 NOTE — Progress Notes (Signed)
Physical Therapy Treatment Patient Details Name: Rita Chapman MRN: [id number] DOB: 28-Mar-1926 Today's Date: 04/17/2013 Time: 0831-0920 PT Time Calculation (min): 49 min  PT Assessment / Plan / Recommendation Comments on Treatment Session  S/p Right hip hemiarthroplasty; Making good gains in mobiltiy, especially with distance ambulated and activity tolerancePt very concerned with having a BM, and we incorporated bathroom practice into session; Encouraged pt to get to [facility] of bedpan    Follow Up Recommendations  SNF     Does the patient have the potential to tolerate intense rehabilitation     Barriers to Discharge        Equipment Recommendations  Rolling walker with 5" wheels    Recommendations for Other Services    Frequency Min 5X/week   Plan Discharge plan remains appropriate    Precautions / Restrictions Precautions Precautions: Posterior Hip Precaution Comments: Educated pt on precautions. Restrictions Weight Bearing Restrictions: Yes RLE Weight Bearing: Weight bearing as tolerated   Pertinent Vitals/Pain "hurts real bad" unabl eto rate; RN gave pain meds Session conducted on RA; O2 sats ranged 88-90%; restarted O2 end of session via [place] 2 liters    Mobility  Bed Mobility Bed Mobility: Supine to Sit;Sitting - Scoot to Edge of Bed Supine to Sit: 1: +2 Total assist Supine to Sit: Patient Percentage: 50% Sitting - Scoot to Edge of Bed: 4: Min assist Details for Bed Mobility Assistance: Required the bulk of the physical assist to elevate trunk from bed; Pt half-bridged to EOB well with small scoots and  min assist Transfers Transfers: Sit to Stand;Stand to Sit Sit to Stand: 1: +2 Total assist;3: Mod assist Sit to Stand: Patient Percentage: 60% Stand to Sit: 3: Mod assist Details for Transfer Assistance: Required +2 phsyical assist from bed; Better  sit to stand from 3in1 with use of armrests; Cues for prec Ambulation/Gait Ambulation/Gait Assistance: 1: +2 Total  assist Ambulation/Gait: Patient Percentage: 60% Ambulation Distance (Feet): 30 Feet Assistive device: Rolling walker Ambulation/Gait Assistance Details: continued need for cues for sequencing; tends to step too far into RW, thereby throwing ehr balance off backward; this improved with cues, but pt continues to need reinforcement Gait Pattern: Step-to pattern;Step-through pattern (emerging step-through)    Exercises     PT Diagnosis:    PT Problem List:   PT Treatment Interventions:     PT Goals Acute Rehab PT Goals Time For Goal Achievement: 04/21/13 Potential to Achieve Goals: Good Pt will go Supine/Side to Sit: with modified independence;with HOB 0 degrees PT Goal: Supine/Side to Sit - Progress: Progressing toward goal Pt will go Sit to Stand: with upper extremity assist;with modified independence PT Goal: Sit to Stand - Progress: Progressing toward goal Pt will go Stand to Sit: with upper extremity assist;with modified independence PT Goal: Stand to Sit - Progress: Progressing toward goal Pt will Ambulate: 51 - 150 feet;with min assist;with rolling walker PT Goal: Ambulate - Progress: Progressing toward goal  Visit Information  Last PT Received On: 04/17/13 Assistance Needed: +1    Subjective Data  Subjective: Seems pleased with her progress Patient Stated Goal: I want to go back home   Cognition  Cognition Arousal/Alertness: Awake/alert Behavior During Therapy: WFL for tasks assessed/performed Overall Cognitive Status: Within Functional Limits for tasks assessed    Balance     End of Session PT - End of Session Equipment Utilized During Treatment: Gait belt Activity Tolerance: Patient tolerated treatment well Patient left: in chair;with call bell/phone within reach;with family/visitor present   GP  Van Clines [place], [place] [phone]  04/17/2013, 11:17 AM

## 2013-04-17 NOTE — Progress Notes (Signed)
Social worker assisted with patient discharge to skilled nursing facility, [facility].  SW addressed all family questions and concerns. SW copied chart and added all important documents. SW also set up patient transportation with [employer]. Social Worker will sign off for now as social work intervention is no longer needed.   Sabino Niemann, MSW,  [phone]

## 2013-04-17 NOTE — Discharge Summary (Addendum)
Physician Discharge Summary  Rita Chapman ZOX:[id number] DOB: 04-26-1926 DOA: 04/12/2013  PCP: Doreen Salvage, MD  Admit date: 04/12/2013 Discharge date: 04/17/2013  Time spent: 25 minutes  Recommendations for Outpatient Follow-up:  1. Follow up with PCP in one week.  2. Follow up with orthopedics as recommended 3. Follow up with bmp in 1 to 2 days.   Discharge Diagnoses:  Principal Problem:   Fracture of femoral neck, right Active Problems:   Hypertension   Hypercholesterolemia   Osteoporosis   Pre-operative cardiovascular examination   Acute blood loss anemia   Hyposmolality and/or hyponatremia   Fever, unspecified   Discharge Condition: improved.   Diet recommendation: SODIUM diet  Filed Weights   04/12/13 0303  Weight: 46.267 kg (102 lb)    History of present illness:  Rita Chapman is a 79 y.o. female, with history of hypertension, dyslipidemia, osteoporosis, depression, anxiety who is in good health for her stated age comes to the hospital being transferred from [facility]. Patient apparently was on a trip with her daughter, she tripped and fell while getting out of the car late last night, she fell on her right hip and experienced pain in that area, she then went to [facility] where she was diagnosed with right-sided femoral fracture, family then requested that patient be sent to [facility] for further care. She then arrived to the ER here where her lab work was essentially unremarkable except for mild reactionary leukocytosis, case was discussed with Dr. Lajoyce Corners orthopedic surgeon by the ER MD who requested hospitalist admission.   Hospital Course:  1. Mechanical Fall: with right femoral fracture. S/p right hemi arthroplasty. Pain control and minimal sedation and PT eval recommended CIR, but daughter wants to take her to [facility].  2. Hypertension: optimally controlled.  3. GERD; protonix.  4. Anemia: normocytic. Possibly from AOBL. Continue to monitor. Will  transfuse if H&h drops to less than 8. She underwent transfusion of 2 units of prbc yesterday and her H&H improved to 10 and appears to be stable.  5. Hyponatremia: worsened after changing to normal saline fluids. appears to be siadh, stopped the fluds, serum osmo is 261, and improved sodium today. Will repeat bmp before discharge and outpatient follow up for hyponatremia as she is asymptomatic .  6. Fever: CXR ordered. No leukocytosis. Suspecting it possibly from blood transfusions. No pneumonia on CXR and no UTI.      Procedures: Right hemi arthroplasty on 5/30    Consultations:  ORTHOPEDICS  Cardiology  Discharge Exam: Filed Vitals:   04/16/13 0707 04/16/13 1320 04/16/13 1900 04/17/13 0539  BP: 136/48 137/46 158/72 165/63  Pulse: 71 102 106 82  Temp: 98.2 F (36.8 C) 98.7 F (37.1 C) 98.2 F (36.8 C) 97.8 F (36.6 C)  TempSrc: Oral     Resp: 16 16 16 16   Height:      Weight:      SpO2: 99%  92% 99%   Alert afebrile comfortable.  CVS: S1S2 RRR LUngs; decreased air entry at bases.  Abdomen : soft NT ND BS+  Extremities: pain ROM.     Discharge Instructions  Discharge Orders   Future Orders Complete By Expires     Diet - low sodium heart healthy  As directed     Discharge instructions  As directed     Comments:      Follow up with ortho as recommended Weight bearing as tolerated Check BMP tomorrow to check sodium .    Posterior total  hip precautions  As directed     Weight bearing as tolerated  As directed         Medication List    STOP taking these medications       hydrochlorothiazide 25 MG tablet  Commonly known as:  HYDRODIURIL     PRESCRIPTION MEDICATION      TAKE these medications       alum & mag hydroxide-simeth 200-200-20 MG/5ML suspension  Commonly known as:  MAALOX/MYLANTA  Take 30 mLs by mouth every 4 (four) hours as needed for indigestion.     amLODipine 5 MG tablet  Commonly known as:  NORVASC  Take 5 mg by mouth daily.      aspirin EC 81 MG tablet  Take 81 mg by mouth daily.     enoxaparin 40 MG/0.4ML injection  Commonly known as:  LOVENOX  Inject 0.4 mLs (40 mg total) into the skin daily.     HYDROcodone-acetaminophen 5-325 MG per tablet  Commonly known as:  NORCO  Take 1-2 tablets by mouth every 6 (six) hours as needed for pain. MAXIMUM TOTAL ACETAMINOPHEN DOSE IS 4000 MG PER DAY     losartan 50 MG tablet  Commonly known as:  COZAAR  Take 50 mg by mouth daily.     oxazepam 15 MG capsule  Commonly known as:  SERAX  Take 15 mg by mouth at bedtime as needed for sleep or anxiety.     polyethylene glycol packet  Commonly known as:  MIRALAX / GLYCOLAX  Take 17 g by mouth daily as needed.     ranitidine 300 MG tablet  Commonly known as:  ZANTAC  Take 300 mg by mouth at bedtime.     sennosides-docusate sodium 8.6-50 MG tablet  Commonly known as:  SENOKOT-S  Take 2 tablets by mouth daily.     sertraline 25 MG tablet  Commonly known as:  ZOLOFT  Take 25 mg by mouth daily.     simvastatin 20 MG tablet  Commonly known as:  ZOCOR  Take 20 mg by mouth every evening.       Allergies  Allergen Reactions  . Bactrim (Sulfamethoxazole W-Trimethoprim) Other (See Comments)    Unknown   . Sulfa Antibiotics Other (See Comments)    Unknown        Follow-up Information   Follow up with Eulas Post, MD. Schedule an appointment as soon as possible for a visit in 2 weeks.   Contact information:   [address] [place] [address] [phone]        The results of significant diagnostics from this hospitalization (including imaging, microbiology, ancillary and laboratory) are listed below for reference.    Significant Diagnostic Studies: Dg Chest 1 View  04/12/2013   *RADIOLOGY REPORT*  Clinical Data: Preoperative chest radiograph for right hip fracture.  CHEST - 1 VIEW  Comparison: None.  Findings: The lungs are mildly hyperexpanded, with flattening of the hemidiaphragms,  likely reflecting COPD.  Vascular congestion may be transient in nature.  No definite focal consolidation, pleural effusion or pneumothorax is seen.  The cardiomediastinal silhouette is normal in size; calcification is noted in the aortic arch.  No acute osseous abnormalities are seen.  IMPRESSION: Suspect COPD; vascular congestion may be transient in nature. Lungs otherwise clear.   Original Report Authenticated By: Tonia Ghent, M.D.   Dg Chest 2 View  04/16/2013   *RADIOLOGY REPORT*  Clinical Data: Fever.  Postop from hip fracture repair peri  CHEST - 2 VIEW  Comparison: 04/12/2013  Findings: A small left pleural effusion is seen with mild left basilar atelectasis.  Right lung is clear.  Mild cardiomegaly stable.  No evidence of congestive heart failure or pulmonary air space disease.  IMPRESSION: New small left pleural effusion and mild left basilar atelectasis.   Original Report Authenticated By: Myles Rosenthal, M.D.   Dg Pelvis Portable  04/13/2013   *RADIOLOGY REPORT*  Clinical Data: Postop right hip.  PORTABLE PELVIS  Comparison: 04/12/2013  Findings: Changes of right hip replacement.  No hardware or bony complicating feature.  Mild osteopenia.  IMPRESSION: Right hip replacement.  No complicating feature.   Original Report Authenticated By: Charlett Nose, M.D.   Dg Hip Portable 1 View Right  04/13/2013   *RADIOLOGY REPORT*  Clinical Data: Postop right hip.  PORTABLE RIGHT HIP - 1 VIEW  Comparison: 04/12/2013  Findings: Cross-table lateral demonstrates changes of right hip replacement.  Normal alignment.  No hardware bony complicating feature.  IMPRESSION: Right hip replacement.  No visible complicating feature.   Original Report Authenticated By: Charlett Nose, M.D.   Dg Hip Portable 1 View Right  04/12/2013   *RADIOLOGY REPORT*  Clinical Data: Hip pain.  Evaluate for possible hip fracture.  PORTABLE RIGHT HIP - 1 VIEW  Comparison: Right femur radiograph 04/12/2013.  Findings: Single view of the right  hip redemonstrates the presence of a displaced subcapital fracture of the right femoral neck.  This was better demonstrated on the recent study obtained at [facility] on 04/12/2013.  Mild medial displacement of the distal femoral fragment is noted.  On this single-view examination, the femoral head appears located within the right acetabulum (it was located on the prior study).  IMPRESSION: 1.  Mildly displaced subcapital right femoral neck fracture redemonstrated, as above.   Original Report Authenticated By: Trudie Reed, M.D.    Microbiology: No results found for this or any previous visit (from the past 240 hour(s)).   Labs: Basic Metabolic Panel:  Recent Labs Lab 04/12/13 0345 04/13/13 0615 04/14/13 0530 04/15/13 0454 04/16/13 0515  NA 138 131* 128* 124* 127*  K 3.5 3.6 4.1 3.4* 3.6  CL 98 95* 93* 91* 89*  CO2 25 28 28 30  32  GLUCOSE 130* 151* 124* 118* 113*  BUN 16 13 16 15 13   CREATININE 0.60 0.58 0.62 0.61 0.51  CALCIUM 10.4 9.4 8.7 8.3* 8.4   Liver Function Tests:  Recent Labs Lab 04/12/13 0345 04/13/13 0615  AST 33 19  ALT 26 14  ALKPHOS 89 51  BILITOT 0.6 0.5  PROT 7.9 5.9*  ALBUMIN 4.5 3.2*   No results found for this basename: LIPASE, AMYLASE,  in the last 168 hours No results found for this basename: AMMONIA,  in the last 168 hours CBC:  Recent Labs Lab 04/12/13 0345 04/13/13 0615 04/14/13 0530 04/15/13 0715 04/16/13 0515  WBC 12.9* 6.6 6.9 4.1 4.6  HGB 14.2 11.6* 10.1* 7.6* 10.1*  HCT 40.4 33.0* 28.7* 20.7* 27.6*  MCV 87.4 87.5 85.2 84.1 83.1  PLT 149* 107* 122* 98* 96*   Cardiac Enzymes: No results found for this basename: CKTOTAL, CKMB, CKMBINDEX, TROPONINI,  in the last 168 hours BNP: BNP (last 3 results) No results found for this basename: PROBNP,  in the last 8760 hours CBG: No results found for this basename: GLUCAP,  in the last 168 hours     Signed:  Jacquis Paxton  [facility] 04/17/2013, 10:24  AM   Addendum:  Sherron Monday  to the patient before discharge about staying one more day to bring her sodium up, but she wanted to go to rehab to further work up done there if possible.  Discussed with the daughter about the low sodium levels and the fact that she is asymptomatic. Recommend checking BMP tomorrow to check sodium and follow up the results with the MD at the facility for further management. The daughter agreed and would like to pursue that instead of staying in the hospital one more day.   Kathlen Mody, MD [phone]

## 2013-04-22 DIAGNOSIS — E871 Hypo-osmolality and hyponatremia: Secondary | ICD-10-CM | POA: Diagnosis not present

## 2013-04-22 DIAGNOSIS — N039 Chronic nephritic syndrome with unspecified morphologic changes: Secondary | ICD-10-CM | POA: Diagnosis not present

## 2013-04-22 DIAGNOSIS — S72143A Displaced intertrochanteric fracture of unspecified femur, initial encounter for closed fracture: Secondary | ICD-10-CM | POA: Diagnosis not present

## 2013-04-22 DIAGNOSIS — D638 Anemia in other chronic diseases classified elsewhere: Secondary | ICD-10-CM | POA: Diagnosis not present

## 2013-05-08 DIAGNOSIS — S72009D Fracture of unspecified part of neck of unspecified femur, subsequent encounter for closed fracture with routine healing: Secondary | ICD-10-CM | POA: Diagnosis not present

## 2013-05-08 DIAGNOSIS — R279 Unspecified lack of coordination: Secondary | ICD-10-CM | POA: Diagnosis not present

## 2013-05-08 DIAGNOSIS — I509 Heart failure, unspecified: Secondary | ICD-10-CM | POA: Diagnosis not present

## 2013-05-08 DIAGNOSIS — I10 Essential (primary) hypertension: Secondary | ICD-10-CM | POA: Diagnosis not present

## 2013-05-08 DIAGNOSIS — F411 Generalized anxiety disorder: Secondary | ICD-10-CM | POA: Diagnosis not present

## 2013-05-08 DIAGNOSIS — Z9181 History of falling: Secondary | ICD-10-CM | POA: Diagnosis not present

## 2013-05-08 DIAGNOSIS — E785 Hyperlipidemia, unspecified: Secondary | ICD-10-CM | POA: Diagnosis not present

## 2013-05-08 DIAGNOSIS — Z8781 Personal history of (healed) traumatic fracture: Secondary | ICD-10-CM | POA: Diagnosis not present

## 2013-05-08 DIAGNOSIS — M6281 Muscle weakness (generalized): Secondary | ICD-10-CM | POA: Diagnosis not present

## 2013-05-08 DIAGNOSIS — F329 Major depressive disorder, single episode, unspecified: Secondary | ICD-10-CM | POA: Diagnosis not present

## 2013-05-09 DIAGNOSIS — M6281 Muscle weakness (generalized): Secondary | ICD-10-CM | POA: Diagnosis not present

## 2013-05-09 DIAGNOSIS — E785 Hyperlipidemia, unspecified: Secondary | ICD-10-CM | POA: Diagnosis not present

## 2013-05-09 DIAGNOSIS — I10 Essential (primary) hypertension: Secondary | ICD-10-CM | POA: Diagnosis not present

## 2013-05-09 DIAGNOSIS — I509 Heart failure, unspecified: Secondary | ICD-10-CM | POA: Diagnosis not present

## 2013-05-09 DIAGNOSIS — S72009D Fracture of unspecified part of neck of unspecified femur, subsequent encounter for closed fracture with routine healing: Secondary | ICD-10-CM | POA: Diagnosis not present

## 2013-05-09 DIAGNOSIS — R279 Unspecified lack of coordination: Secondary | ICD-10-CM | POA: Diagnosis not present

## 2013-05-10 DIAGNOSIS — I10 Essential (primary) hypertension: Secondary | ICD-10-CM | POA: Diagnosis not present

## 2013-05-10 DIAGNOSIS — E785 Hyperlipidemia, unspecified: Secondary | ICD-10-CM | POA: Diagnosis not present

## 2013-05-10 DIAGNOSIS — S72009D Fracture of unspecified part of neck of unspecified femur, subsequent encounter for closed fracture with routine healing: Secondary | ICD-10-CM | POA: Diagnosis not present

## 2013-05-10 DIAGNOSIS — I509 Heart failure, unspecified: Secondary | ICD-10-CM | POA: Diagnosis not present

## 2013-05-10 DIAGNOSIS — M6281 Muscle weakness (generalized): Secondary | ICD-10-CM | POA: Diagnosis not present

## 2013-05-10 DIAGNOSIS — R279 Unspecified lack of coordination: Secondary | ICD-10-CM | POA: Diagnosis not present

## 2013-05-14 DIAGNOSIS — M6281 Muscle weakness (generalized): Secondary | ICD-10-CM | POA: Diagnosis not present

## 2013-05-14 DIAGNOSIS — R279 Unspecified lack of coordination: Secondary | ICD-10-CM | POA: Diagnosis not present

## 2013-05-14 DIAGNOSIS — E785 Hyperlipidemia, unspecified: Secondary | ICD-10-CM | POA: Diagnosis not present

## 2013-05-14 DIAGNOSIS — I10 Essential (primary) hypertension: Secondary | ICD-10-CM | POA: Diagnosis not present

## 2013-05-14 DIAGNOSIS — I509 Heart failure, unspecified: Secondary | ICD-10-CM | POA: Diagnosis not present

## 2013-05-14 DIAGNOSIS — S72009D Fracture of unspecified part of neck of unspecified femur, subsequent encounter for closed fracture with routine healing: Secondary | ICD-10-CM | POA: Diagnosis not present

## 2013-05-15 DIAGNOSIS — Z79899 Other long term (current) drug therapy: Secondary | ICD-10-CM | POA: Diagnosis not present

## 2013-05-15 DIAGNOSIS — R609 Edema, unspecified: Secondary | ICD-10-CM | POA: Diagnosis not present

## 2013-05-15 DIAGNOSIS — E782 Mixed hyperlipidemia: Secondary | ICD-10-CM | POA: Diagnosis not present

## 2013-05-15 DIAGNOSIS — I10 Essential (primary) hypertension: Secondary | ICD-10-CM | POA: Diagnosis not present

## 2013-05-21 DIAGNOSIS — I10 Essential (primary) hypertension: Secondary | ICD-10-CM | POA: Diagnosis not present

## 2013-05-21 DIAGNOSIS — S72009D Fracture of unspecified part of neck of unspecified femur, subsequent encounter for closed fracture with routine healing: Secondary | ICD-10-CM | POA: Diagnosis not present

## 2013-05-21 DIAGNOSIS — E785 Hyperlipidemia, unspecified: Secondary | ICD-10-CM | POA: Diagnosis not present

## 2013-05-21 DIAGNOSIS — M6281 Muscle weakness (generalized): Secondary | ICD-10-CM | POA: Diagnosis not present

## 2013-05-21 DIAGNOSIS — R279 Unspecified lack of coordination: Secondary | ICD-10-CM | POA: Diagnosis not present

## 2013-05-21 DIAGNOSIS — I509 Heart failure, unspecified: Secondary | ICD-10-CM | POA: Diagnosis not present

## 2013-05-23 DIAGNOSIS — I509 Heart failure, unspecified: Secondary | ICD-10-CM | POA: Diagnosis not present

## 2013-05-23 DIAGNOSIS — R279 Unspecified lack of coordination: Secondary | ICD-10-CM | POA: Diagnosis not present

## 2013-05-23 DIAGNOSIS — S72009D Fracture of unspecified part of neck of unspecified femur, subsequent encounter for closed fracture with routine healing: Secondary | ICD-10-CM | POA: Diagnosis not present

## 2013-05-23 DIAGNOSIS — E785 Hyperlipidemia, unspecified: Secondary | ICD-10-CM | POA: Diagnosis not present

## 2013-05-23 DIAGNOSIS — I10 Essential (primary) hypertension: Secondary | ICD-10-CM | POA: Diagnosis not present

## 2013-05-23 DIAGNOSIS — M6281 Muscle weakness (generalized): Secondary | ICD-10-CM | POA: Diagnosis not present

## 2013-05-24 DIAGNOSIS — E785 Hyperlipidemia, unspecified: Secondary | ICD-10-CM | POA: Diagnosis not present

## 2013-05-24 DIAGNOSIS — I509 Heart failure, unspecified: Secondary | ICD-10-CM | POA: Diagnosis not present

## 2013-05-24 DIAGNOSIS — S72009D Fracture of unspecified part of neck of unspecified femur, subsequent encounter for closed fracture with routine healing: Secondary | ICD-10-CM | POA: Diagnosis not present

## 2013-05-24 DIAGNOSIS — M6281 Muscle weakness (generalized): Secondary | ICD-10-CM | POA: Diagnosis not present

## 2013-05-24 DIAGNOSIS — I10 Essential (primary) hypertension: Secondary | ICD-10-CM | POA: Diagnosis not present

## 2013-05-24 DIAGNOSIS — R279 Unspecified lack of coordination: Secondary | ICD-10-CM | POA: Diagnosis not present

## 2013-05-25 DIAGNOSIS — M6281 Muscle weakness (generalized): Secondary | ICD-10-CM | POA: Diagnosis not present

## 2013-05-25 DIAGNOSIS — I509 Heart failure, unspecified: Secondary | ICD-10-CM | POA: Diagnosis not present

## 2013-05-25 DIAGNOSIS — E785 Hyperlipidemia, unspecified: Secondary | ICD-10-CM | POA: Diagnosis not present

## 2013-05-25 DIAGNOSIS — I10 Essential (primary) hypertension: Secondary | ICD-10-CM | POA: Diagnosis not present

## 2013-05-25 DIAGNOSIS — R279 Unspecified lack of coordination: Secondary | ICD-10-CM | POA: Diagnosis not present

## 2013-05-25 DIAGNOSIS — S72009D Fracture of unspecified part of neck of unspecified femur, subsequent encounter for closed fracture with routine healing: Secondary | ICD-10-CM | POA: Diagnosis not present

## 2013-05-29 DIAGNOSIS — R609 Edema, unspecified: Secondary | ICD-10-CM | POA: Diagnosis not present

## 2013-05-29 DIAGNOSIS — I10 Essential (primary) hypertension: Secondary | ICD-10-CM | POA: Diagnosis not present

## 2013-06-12 DIAGNOSIS — R609 Edema, unspecified: Secondary | ICD-10-CM | POA: Diagnosis not present

## 2013-06-12 DIAGNOSIS — M25569 Pain in unspecified knee: Secondary | ICD-10-CM | POA: Diagnosis not present

## 2013-06-12 DIAGNOSIS — M79609 Pain in unspecified limb: Secondary | ICD-10-CM | POA: Diagnosis not present

## 2013-07-10 DIAGNOSIS — R609 Edema, unspecified: Secondary | ICD-10-CM | POA: Diagnosis not present

## 2013-07-10 DIAGNOSIS — E7800 Pure hypercholesterolemia, unspecified: Secondary | ICD-10-CM | POA: Diagnosis not present

## 2013-07-10 DIAGNOSIS — I10 Essential (primary) hypertension: Secondary | ICD-10-CM | POA: Diagnosis not present

## 2013-07-10 DIAGNOSIS — M25569 Pain in unspecified knee: Secondary | ICD-10-CM | POA: Diagnosis not present

## 2013-07-10 DIAGNOSIS — M25469 Effusion, unspecified knee: Secondary | ICD-10-CM | POA: Diagnosis not present

## 2013-07-10 DIAGNOSIS — R209 Unspecified disturbances of skin sensation: Secondary | ICD-10-CM | POA: Diagnosis not present

## 2013-07-10 DIAGNOSIS — E559 Vitamin D deficiency, unspecified: Secondary | ICD-10-CM | POA: Diagnosis not present

## 2013-07-10 DIAGNOSIS — R413 Other amnesia: Secondary | ICD-10-CM | POA: Diagnosis not present

## 2013-07-10 DIAGNOSIS — R937 Abnormal findings on diagnostic imaging of other parts of musculoskeletal system: Secondary | ICD-10-CM | POA: Diagnosis not present

## 2013-08-09 DIAGNOSIS — M818 Other osteoporosis without current pathological fracture: Secondary | ICD-10-CM | POA: Diagnosis not present

## 2013-08-09 DIAGNOSIS — M1710 Unilateral primary osteoarthritis, unspecified knee: Secondary | ICD-10-CM | POA: Diagnosis not present

## 2013-08-20 DIAGNOSIS — I359 Nonrheumatic aortic valve disorder, unspecified: Secondary | ICD-10-CM | POA: Diagnosis not present

## 2013-08-20 DIAGNOSIS — R609 Edema, unspecified: Secondary | ICD-10-CM | POA: Diagnosis not present

## 2013-08-20 DIAGNOSIS — I509 Heart failure, unspecified: Secondary | ICD-10-CM | POA: Diagnosis not present

## 2013-10-16 DIAGNOSIS — E7800 Pure hypercholesterolemia, unspecified: Secondary | ICD-10-CM | POA: Diagnosis not present

## 2013-10-16 DIAGNOSIS — M722 Plantar fascial fibromatosis: Secondary | ICD-10-CM | POA: Diagnosis not present

## 2013-10-16 DIAGNOSIS — I10 Essential (primary) hypertension: Secondary | ICD-10-CM | POA: Diagnosis not present

## 2013-10-16 DIAGNOSIS — M810 Age-related osteoporosis without current pathological fracture: Secondary | ICD-10-CM | POA: Diagnosis not present

## 2013-10-16 DIAGNOSIS — M1710 Unilateral primary osteoarthritis, unspecified knee: Secondary | ICD-10-CM | POA: Diagnosis not present

## 2013-11-20 DIAGNOSIS — J069 Acute upper respiratory infection, unspecified: Secondary | ICD-10-CM | POA: Diagnosis not present

## 2013-12-12 DIAGNOSIS — L821 Other seborrheic keratosis: Secondary | ICD-10-CM | POA: Diagnosis not present

## 2013-12-12 DIAGNOSIS — M545 Low back pain, unspecified: Secondary | ICD-10-CM | POA: Diagnosis not present

## 2014-01-24 DIAGNOSIS — F329 Major depressive disorder, single episode, unspecified: Secondary | ICD-10-CM | POA: Diagnosis not present

## 2014-01-24 DIAGNOSIS — J069 Acute upper respiratory infection, unspecified: Secondary | ICD-10-CM | POA: Diagnosis not present

## 2014-01-24 DIAGNOSIS — E7800 Pure hypercholesterolemia, unspecified: Secondary | ICD-10-CM | POA: Diagnosis not present

## 2014-01-24 DIAGNOSIS — I10 Essential (primary) hypertension: Secondary | ICD-10-CM | POA: Diagnosis not present

## 2014-01-24 DIAGNOSIS — E782 Mixed hyperlipidemia: Secondary | ICD-10-CM | POA: Diagnosis not present

## 2014-01-24 DIAGNOSIS — E559 Vitamin D deficiency, unspecified: Secondary | ICD-10-CM | POA: Diagnosis not present

## 2014-01-29 DIAGNOSIS — R079 Chest pain, unspecified: Secondary | ICD-10-CM | POA: Diagnosis not present

## 2014-01-29 DIAGNOSIS — R5381 Other malaise: Secondary | ICD-10-CM | POA: Diagnosis not present

## 2014-01-29 DIAGNOSIS — R072 Precordial pain: Secondary | ICD-10-CM | POA: Diagnosis not present

## 2014-01-29 DIAGNOSIS — N3000 Acute cystitis without hematuria: Secondary | ICD-10-CM | POA: Diagnosis not present

## 2014-01-29 DIAGNOSIS — N390 Urinary tract infection, site not specified: Secondary | ICD-10-CM | POA: Diagnosis not present

## 2014-01-29 DIAGNOSIS — R5383 Other fatigue: Secondary | ICD-10-CM | POA: Diagnosis not present

## 2014-02-06 DIAGNOSIS — E7800 Pure hypercholesterolemia, unspecified: Secondary | ICD-10-CM | POA: Diagnosis not present

## 2014-02-06 DIAGNOSIS — I10 Essential (primary) hypertension: Secondary | ICD-10-CM | POA: Diagnosis not present

## 2014-02-06 DIAGNOSIS — R0789 Other chest pain: Secondary | ICD-10-CM | POA: Diagnosis not present

## 2014-02-26 IMAGING — CR DG CHEST 2V
2 series · 2 of 2 positions shown · non-contrast
Comparison: 04/12/2013

CLINICAL DATA: Fever.  Postop from hip fracture repair peri

CHEST - 2 VIEW

[w chest lat]
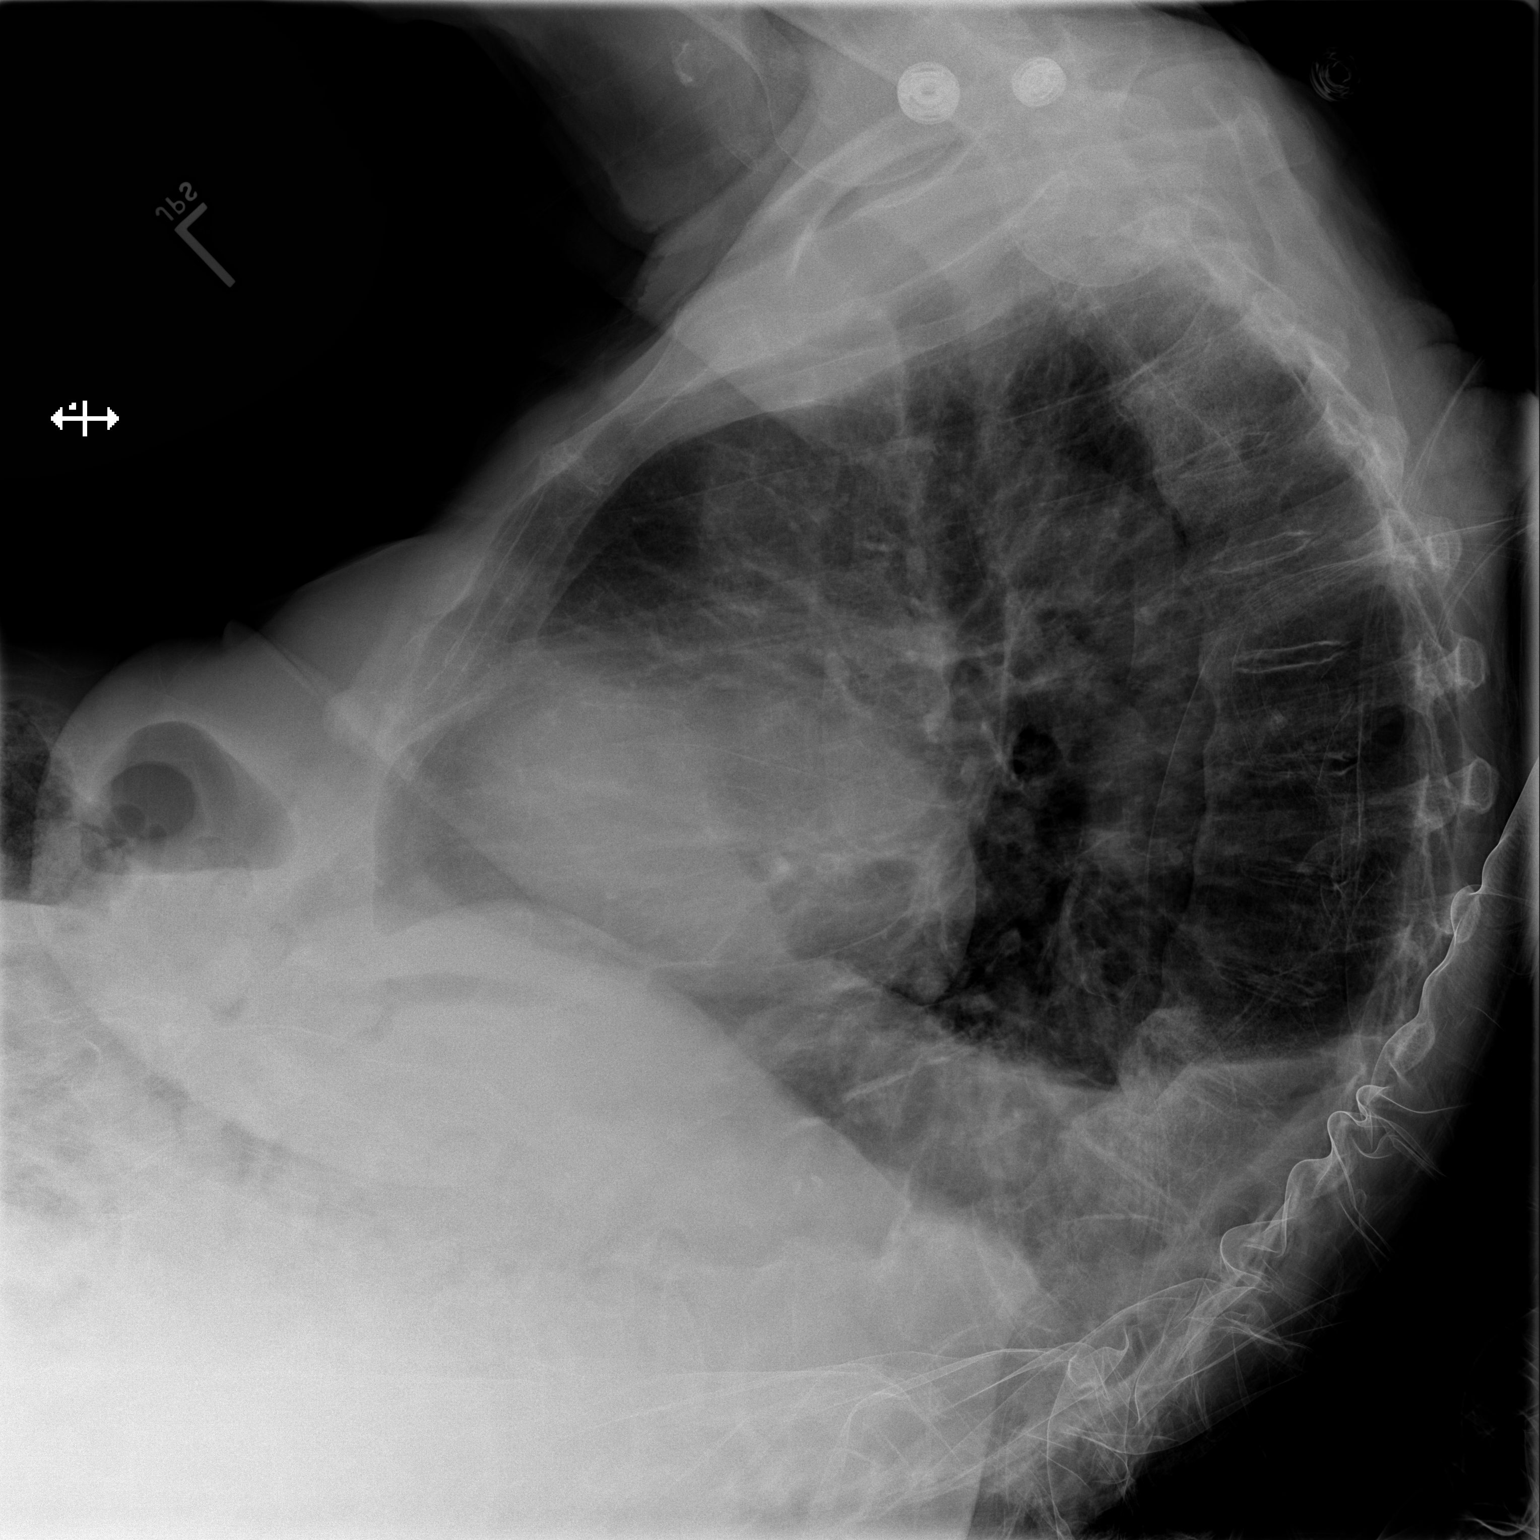

[x chest ap]
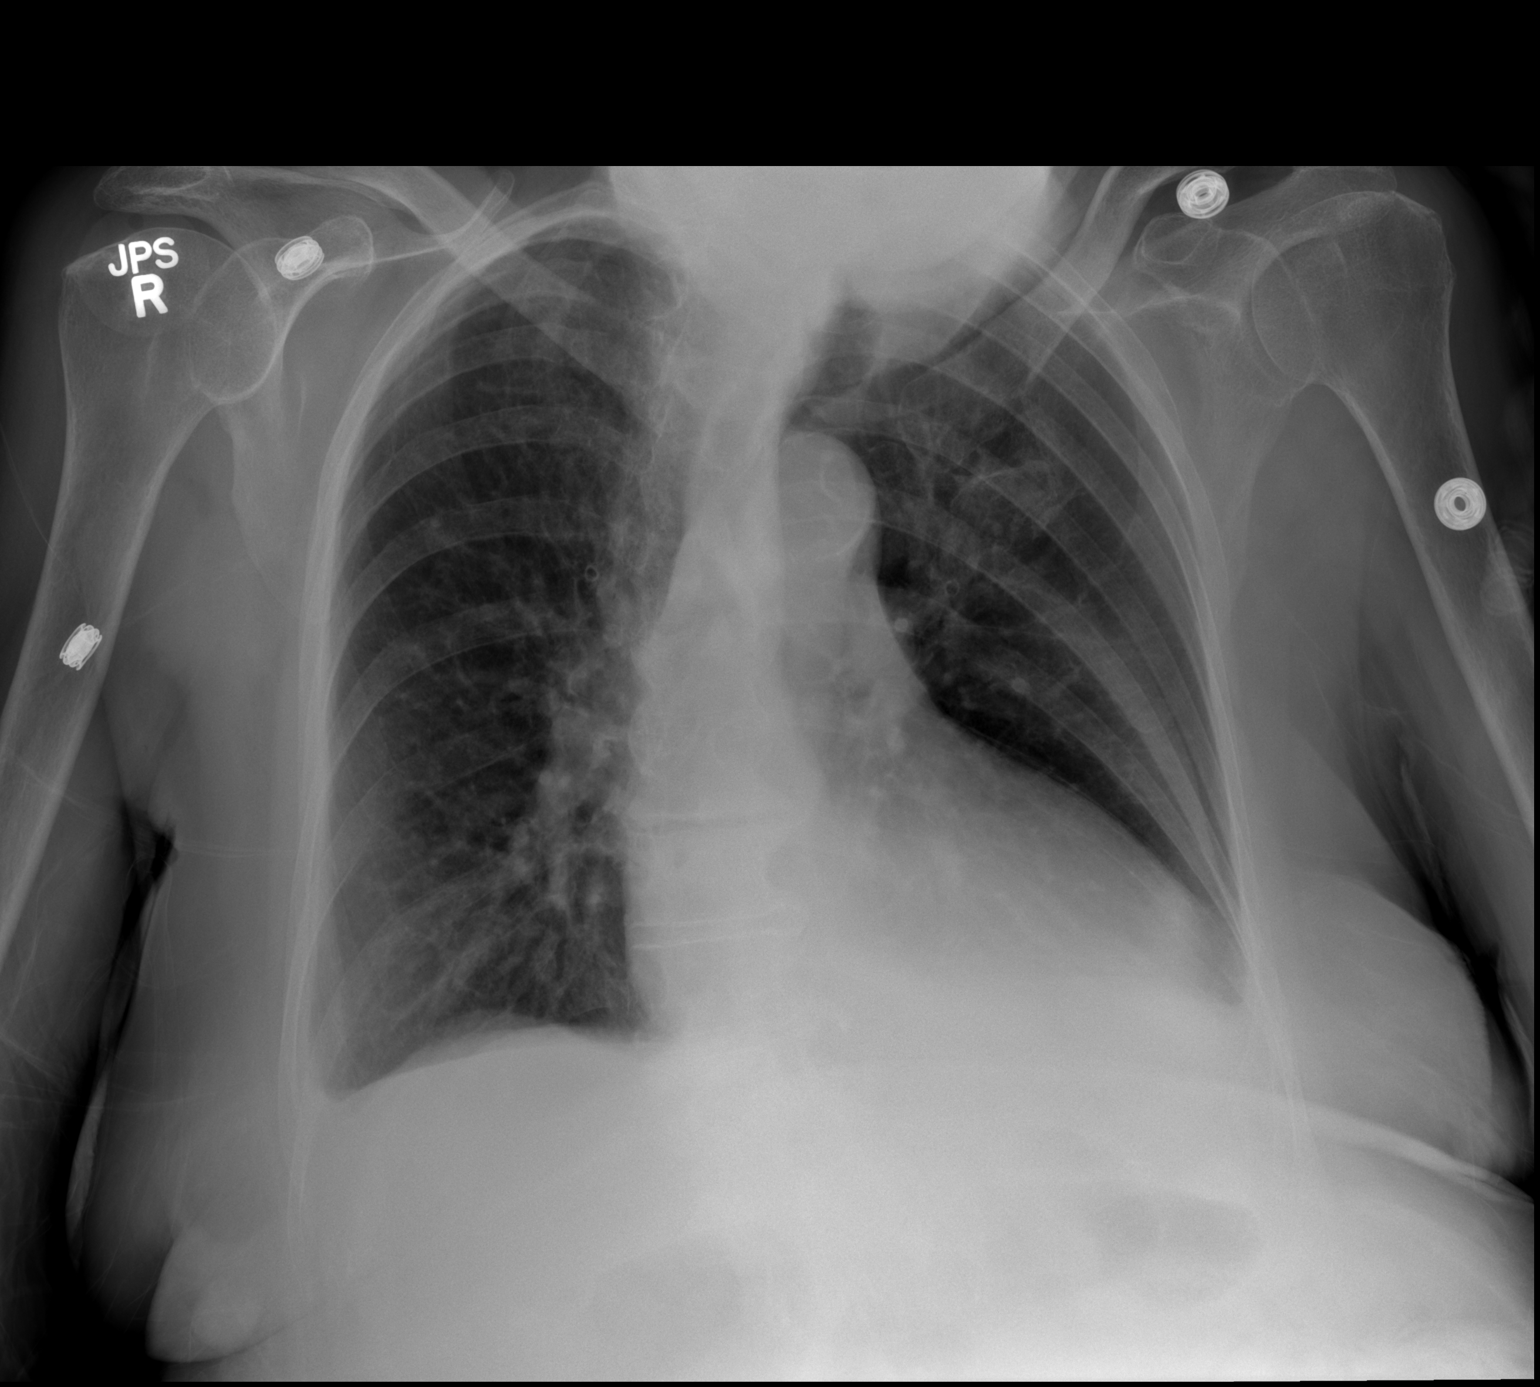

[2 of 2 positions shown; findings below may reference images not displayed]

FINDINGS: A small left pleural effusion is seen with mild left
basilar atelectasis.  Right lung is clear.  Mild cardiomegaly
stable.  No evidence of congestive heart failure or pulmonary air
space disease.
IMPRESSION: New small left pleural effusion and mild left basilar atelectasis.

## 2014-03-05 DIAGNOSIS — H4011X0 Primary open-angle glaucoma, stage unspecified: Secondary | ICD-10-CM | POA: Diagnosis not present

## 2014-03-05 DIAGNOSIS — H26499 Other secondary cataract, unspecified eye: Secondary | ICD-10-CM | POA: Diagnosis not present

## 2014-03-20 DIAGNOSIS — R21 Rash and other nonspecific skin eruption: Secondary | ICD-10-CM | POA: Diagnosis not present

## 2014-03-20 DIAGNOSIS — M1610 Unilateral primary osteoarthritis, unspecified hip: Secondary | ICD-10-CM | POA: Diagnosis not present

## 2014-03-20 DIAGNOSIS — M169 Osteoarthritis of hip, unspecified: Secondary | ICD-10-CM | POA: Diagnosis not present

## 2014-03-20 DIAGNOSIS — M25559 Pain in unspecified hip: Secondary | ICD-10-CM | POA: Diagnosis not present

## 2014-03-20 DIAGNOSIS — Z96649 Presence of unspecified artificial hip joint: Secondary | ICD-10-CM | POA: Diagnosis not present

## 2014-04-24 DIAGNOSIS — J0100 Acute maxillary sinusitis, unspecified: Secondary | ICD-10-CM | POA: Diagnosis not present

## 2014-04-24 DIAGNOSIS — L723 Sebaceous cyst: Secondary | ICD-10-CM | POA: Diagnosis not present

## 2014-05-07 DIAGNOSIS — E782 Mixed hyperlipidemia: Secondary | ICD-10-CM | POA: Diagnosis not present

## 2014-05-07 DIAGNOSIS — E7800 Pure hypercholesterolemia, unspecified: Secondary | ICD-10-CM | POA: Diagnosis not present

## 2014-05-07 DIAGNOSIS — I10 Essential (primary) hypertension: Secondary | ICD-10-CM | POA: Diagnosis not present

## 2014-05-07 DIAGNOSIS — I509 Heart failure, unspecified: Secondary | ICD-10-CM | POA: Diagnosis not present

## 2014-05-07 DIAGNOSIS — E559 Vitamin D deficiency, unspecified: Secondary | ICD-10-CM | POA: Diagnosis not present

## 2014-05-07 DIAGNOSIS — F329 Major depressive disorder, single episode, unspecified: Secondary | ICD-10-CM | POA: Diagnosis not present

## 2014-06-07 DIAGNOSIS — J309 Allergic rhinitis, unspecified: Secondary | ICD-10-CM | POA: Diagnosis not present

## 2014-07-11 DIAGNOSIS — J0100 Acute maxillary sinusitis, unspecified: Secondary | ICD-10-CM | POA: Diagnosis not present

## 2014-07-11 DIAGNOSIS — R5383 Other fatigue: Secondary | ICD-10-CM | POA: Diagnosis not present

## 2014-07-11 DIAGNOSIS — R5381 Other malaise: Secondary | ICD-10-CM | POA: Diagnosis not present

## 2014-07-16 DIAGNOSIS — J209 Acute bronchitis, unspecified: Secondary | ICD-10-CM | POA: Diagnosis not present

## 2014-07-25 DIAGNOSIS — H4011X0 Primary open-angle glaucoma, stage unspecified: Secondary | ICD-10-CM | POA: Diagnosis not present

## 2014-07-25 DIAGNOSIS — G43109 Migraine with aura, not intractable, without status migrainosus: Secondary | ICD-10-CM | POA: Diagnosis not present

## 2014-08-16 DIAGNOSIS — Z87891 Personal history of nicotine dependence: Secondary | ICD-10-CM | POA: Diagnosis not present

## 2014-08-16 DIAGNOSIS — Z79899 Other long term (current) drug therapy: Secondary | ICD-10-CM | POA: Diagnosis not present

## 2014-08-16 DIAGNOSIS — M1990 Unspecified osteoarthritis, unspecified site: Secondary | ICD-10-CM | POA: Diagnosis not present

## 2014-08-16 DIAGNOSIS — R202 Paresthesia of skin: Secondary | ICD-10-CM | POA: Diagnosis not present

## 2014-08-16 DIAGNOSIS — I209 Angina pectoris, unspecified: Secondary | ICD-10-CM | POA: Diagnosis not present

## 2014-08-16 DIAGNOSIS — K219 Gastro-esophageal reflux disease without esophagitis: Secondary | ICD-10-CM | POA: Diagnosis not present

## 2014-08-16 DIAGNOSIS — R079 Chest pain, unspecified: Secondary | ICD-10-CM | POA: Diagnosis not present

## 2014-08-16 DIAGNOSIS — R208 Other disturbances of skin sensation: Secondary | ICD-10-CM | POA: Diagnosis not present

## 2014-08-16 DIAGNOSIS — F419 Anxiety disorder, unspecified: Secondary | ICD-10-CM | POA: Diagnosis not present

## 2014-08-16 DIAGNOSIS — I10 Essential (primary) hypertension: Secondary | ICD-10-CM | POA: Diagnosis not present

## 2014-08-16 DIAGNOSIS — E780 Pure hypercholesterolemia: Secondary | ICD-10-CM | POA: Diagnosis not present

## 2014-08-17 DIAGNOSIS — K219 Gastro-esophageal reflux disease without esophagitis: Secondary | ICD-10-CM | POA: Diagnosis not present

## 2014-08-17 DIAGNOSIS — R0789 Other chest pain: Secondary | ICD-10-CM | POA: Diagnosis not present

## 2014-08-17 DIAGNOSIS — I498 Other specified cardiac arrhythmias: Secondary | ICD-10-CM | POA: Diagnosis not present

## 2014-08-17 DIAGNOSIS — F419 Anxiety disorder, unspecified: Secondary | ICD-10-CM | POA: Diagnosis not present

## 2014-08-17 DIAGNOSIS — E780 Pure hypercholesterolemia: Secondary | ICD-10-CM | POA: Diagnosis not present

## 2014-08-17 DIAGNOSIS — R079 Chest pain, unspecified: Secondary | ICD-10-CM | POA: Diagnosis not present

## 2014-08-26 DIAGNOSIS — I10 Essential (primary) hypertension: Secondary | ICD-10-CM | POA: Diagnosis not present

## 2014-08-26 DIAGNOSIS — F411 Generalized anxiety disorder: Secondary | ICD-10-CM | POA: Diagnosis not present

## 2014-09-03 DIAGNOSIS — K219 Gastro-esophageal reflux disease without esophagitis: Secondary | ICD-10-CM | POA: Diagnosis not present

## 2014-09-03 DIAGNOSIS — J370 Chronic laryngitis: Secondary | ICD-10-CM | POA: Diagnosis not present

## 2014-09-05 DIAGNOSIS — L723 Sebaceous cyst: Secondary | ICD-10-CM | POA: Diagnosis not present

## 2014-09-20 DIAGNOSIS — J0100 Acute maxillary sinusitis, unspecified: Secondary | ICD-10-CM | POA: Diagnosis not present

## 2014-10-02 DIAGNOSIS — R229 Localized swelling, mass and lump, unspecified: Secondary | ICD-10-CM | POA: Diagnosis not present

## 2014-10-02 DIAGNOSIS — L723 Sebaceous cyst: Secondary | ICD-10-CM | POA: Diagnosis not present

## 2014-12-12 DIAGNOSIS — I10 Essential (primary) hypertension: Secondary | ICD-10-CM | POA: Diagnosis not present

## 2014-12-12 DIAGNOSIS — J069 Acute upper respiratory infection, unspecified: Secondary | ICD-10-CM | POA: Diagnosis not present

## 2014-12-12 DIAGNOSIS — F325 Major depressive disorder, single episode, in full remission: Secondary | ICD-10-CM | POA: Diagnosis not present

## 2014-12-12 DIAGNOSIS — E782 Mixed hyperlipidemia: Secondary | ICD-10-CM | POA: Diagnosis not present

## 2014-12-12 DIAGNOSIS — I509 Heart failure, unspecified: Secondary | ICD-10-CM | POA: Diagnosis not present

## 2014-12-12 DIAGNOSIS — E559 Vitamin D deficiency, unspecified: Secondary | ICD-10-CM | POA: Diagnosis not present

## 2015-01-22 DIAGNOSIS — R51 Headache: Secondary | ICD-10-CM | POA: Diagnosis not present

## 2015-01-22 DIAGNOSIS — G43909 Migraine, unspecified, not intractable, without status migrainosus: Secondary | ICD-10-CM | POA: Diagnosis not present

## 2015-01-27 DIAGNOSIS — M79609 Pain in unspecified limb: Secondary | ICD-10-CM | POA: Diagnosis not present

## 2015-02-06 DIAGNOSIS — M25512 Pain in left shoulder: Secondary | ICD-10-CM | POA: Diagnosis not present

## 2015-02-24 DIAGNOSIS — I10 Essential (primary) hypertension: Secondary | ICD-10-CM | POA: Diagnosis not present

## 2015-03-26 DIAGNOSIS — E559 Vitamin D deficiency, unspecified: Secondary | ICD-10-CM | POA: Diagnosis not present

## 2015-03-26 DIAGNOSIS — F325 Major depressive disorder, single episode, in full remission: Secondary | ICD-10-CM | POA: Diagnosis not present

## 2015-03-26 DIAGNOSIS — I509 Heart failure, unspecified: Secondary | ICD-10-CM | POA: Diagnosis not present

## 2015-03-26 DIAGNOSIS — J069 Acute upper respiratory infection, unspecified: Secondary | ICD-10-CM | POA: Diagnosis not present

## 2015-03-26 DIAGNOSIS — I10 Essential (primary) hypertension: Secondary | ICD-10-CM | POA: Diagnosis not present

## 2015-03-26 DIAGNOSIS — E782 Mixed hyperlipidemia: Secondary | ICD-10-CM | POA: Diagnosis not present

## 2015-03-28 DIAGNOSIS — H02052 Trichiasis without entropian right lower eyelid: Secondary | ICD-10-CM | POA: Diagnosis not present

## 2015-03-28 DIAGNOSIS — H4011X2 Primary open-angle glaucoma, moderate stage: Secondary | ICD-10-CM | POA: Diagnosis not present

## 2015-05-08 DIAGNOSIS — L820 Inflamed seborrheic keratosis: Secondary | ICD-10-CM | POA: Diagnosis not present

## 2015-05-08 DIAGNOSIS — L209 Atopic dermatitis, unspecified: Secondary | ICD-10-CM | POA: Diagnosis not present

## 2015-05-08 DIAGNOSIS — L720 Epidermal cyst: Secondary | ICD-10-CM | POA: Diagnosis not present

## 2015-05-14 DIAGNOSIS — R51 Headache: Secondary | ICD-10-CM | POA: Diagnosis not present

## 2015-05-16 DIAGNOSIS — S80811A Abrasion, right lower leg, initial encounter: Secondary | ICD-10-CM | POA: Diagnosis not present

## 2015-05-22 DIAGNOSIS — G501 Atypical facial pain: Secondary | ICD-10-CM | POA: Diagnosis not present

## 2015-05-22 DIAGNOSIS — K219 Gastro-esophageal reflux disease without esophagitis: Secondary | ICD-10-CM | POA: Diagnosis not present

## 2015-06-05 DIAGNOSIS — N3000 Acute cystitis without hematuria: Secondary | ICD-10-CM | POA: Diagnosis not present

## 2015-06-05 DIAGNOSIS — N3001 Acute cystitis with hematuria: Secondary | ICD-10-CM | POA: Diagnosis not present

## 2015-06-05 DIAGNOSIS — I10 Essential (primary) hypertension: Secondary | ICD-10-CM | POA: Diagnosis not present

## 2015-06-05 DIAGNOSIS — Z23 Encounter for immunization: Secondary | ICD-10-CM | POA: Diagnosis not present

## 2015-06-05 DIAGNOSIS — R358 Other polyuria: Secondary | ICD-10-CM | POA: Diagnosis not present

## 2015-06-05 DIAGNOSIS — I509 Heart failure, unspecified: Secondary | ICD-10-CM | POA: Diagnosis not present

## 2015-06-09 DIAGNOSIS — R799 Abnormal finding of blood chemistry, unspecified: Secondary | ICD-10-CM | POA: Diagnosis not present

## 2015-06-09 DIAGNOSIS — D696 Thrombocytopenia, unspecified: Secondary | ICD-10-CM | POA: Diagnosis not present

## 2015-06-25 DIAGNOSIS — I10 Essential (primary) hypertension: Secondary | ICD-10-CM | POA: Diagnosis not present

## 2015-07-22 DIAGNOSIS — J0100 Acute maxillary sinusitis, unspecified: Secondary | ICD-10-CM | POA: Diagnosis not present

## 2015-07-24 DIAGNOSIS — R5383 Other fatigue: Secondary | ICD-10-CM | POA: Diagnosis not present

## 2015-07-24 DIAGNOSIS — G44209 Tension-type headache, unspecified, not intractable: Secondary | ICD-10-CM | POA: Diagnosis not present

## 2015-07-24 DIAGNOSIS — H4011X2 Primary open-angle glaucoma, moderate stage: Secondary | ICD-10-CM | POA: Diagnosis not present

## 2015-07-24 DIAGNOSIS — H3561 Retinal hemorrhage, right eye: Secondary | ICD-10-CM | POA: Diagnosis not present

## 2015-07-24 DIAGNOSIS — J069 Acute upper respiratory infection, unspecified: Secondary | ICD-10-CM | POA: Diagnosis not present

## 2015-08-27 DIAGNOSIS — R634 Abnormal weight loss: Secondary | ICD-10-CM | POA: Diagnosis not present

## 2015-08-27 DIAGNOSIS — J069 Acute upper respiratory infection, unspecified: Secondary | ICD-10-CM | POA: Diagnosis not present

## 2015-08-27 DIAGNOSIS — R5383 Other fatigue: Secondary | ICD-10-CM | POA: Diagnosis not present

## 2015-08-27 DIAGNOSIS — M25519 Pain in unspecified shoulder: Secondary | ICD-10-CM | POA: Diagnosis not present

## 2015-09-05 DIAGNOSIS — R634 Abnormal weight loss: Secondary | ICD-10-CM | POA: Diagnosis not present

## 2015-09-29 DIAGNOSIS — J0100 Acute maxillary sinusitis, unspecified: Secondary | ICD-10-CM | POA: Diagnosis not present

## 2015-09-29 DIAGNOSIS — J309 Allergic rhinitis, unspecified: Secondary | ICD-10-CM | POA: Diagnosis not present

## 2015-10-02 DIAGNOSIS — J060 Acute laryngopharyngitis: Secondary | ICD-10-CM | POA: Diagnosis not present

## 2015-10-02 DIAGNOSIS — R946 Abnormal results of thyroid function studies: Secondary | ICD-10-CM | POA: Diagnosis not present

## 2015-10-02 DIAGNOSIS — R799 Abnormal finding of blood chemistry, unspecified: Secondary | ICD-10-CM | POA: Diagnosis not present

## 2015-10-22 DIAGNOSIS — L820 Inflamed seborrheic keratosis: Secondary | ICD-10-CM | POA: Diagnosis not present

## 2015-11-04 DIAGNOSIS — J060 Acute laryngopharyngitis: Secondary | ICD-10-CM | POA: Diagnosis not present

## 2015-11-06 DIAGNOSIS — J0100 Acute maxillary sinusitis, unspecified: Secondary | ICD-10-CM | POA: Diagnosis not present

## 2015-12-09 DIAGNOSIS — J0100 Acute maxillary sinusitis, unspecified: Secondary | ICD-10-CM | POA: Diagnosis not present

## 2015-12-09 DIAGNOSIS — J309 Allergic rhinitis, unspecified: Secondary | ICD-10-CM | POA: Diagnosis not present

## 2015-12-17 DIAGNOSIS — M810 Age-related osteoporosis without current pathological fracture: Secondary | ICD-10-CM | POA: Diagnosis not present

## 2015-12-17 DIAGNOSIS — E559 Vitamin D deficiency, unspecified: Secondary | ICD-10-CM | POA: Diagnosis not present

## 2015-12-17 DIAGNOSIS — I509 Heart failure, unspecified: Secondary | ICD-10-CM | POA: Diagnosis not present

## 2015-12-17 DIAGNOSIS — I10 Essential (primary) hypertension: Secondary | ICD-10-CM | POA: Diagnosis not present

## 2015-12-17 DIAGNOSIS — E782 Mixed hyperlipidemia: Secondary | ICD-10-CM | POA: Diagnosis not present

## 2015-12-17 DIAGNOSIS — F325 Major depressive disorder, single episode, in full remission: Secondary | ICD-10-CM | POA: Diagnosis not present

## 2015-12-23 DIAGNOSIS — H1045 Other chronic allergic conjunctivitis: Secondary | ICD-10-CM | POA: Diagnosis not present

## 2015-12-23 DIAGNOSIS — H02052 Trichiasis without entropian right lower eyelid: Secondary | ICD-10-CM | POA: Diagnosis not present

## 2015-12-31 DIAGNOSIS — H401132 Primary open-angle glaucoma, bilateral, moderate stage: Secondary | ICD-10-CM | POA: Diagnosis not present

## 2016-01-07 DIAGNOSIS — H1045 Other chronic allergic conjunctivitis: Secondary | ICD-10-CM | POA: Diagnosis not present

## 2016-01-09 DIAGNOSIS — I10 Essential (primary) hypertension: Secondary | ICD-10-CM | POA: Diagnosis not present

## 2016-01-26 DIAGNOSIS — H401132 Primary open-angle glaucoma, bilateral, moderate stage: Secondary | ICD-10-CM | POA: Diagnosis not present

## 2016-02-05 DIAGNOSIS — J301 Allergic rhinitis due to pollen: Secondary | ICD-10-CM | POA: Diagnosis not present

## 2016-02-05 DIAGNOSIS — M25551 Pain in right hip: Secondary | ICD-10-CM | POA: Diagnosis not present

## 2016-02-16 DIAGNOSIS — K648 Other hemorrhoids: Secondary | ICD-10-CM | POA: Diagnosis not present

## 2016-02-25 DIAGNOSIS — K219 Gastro-esophageal reflux disease without esophagitis: Secondary | ICD-10-CM

## 2016-02-25 DIAGNOSIS — H9201 Otalgia, right ear: Secondary | ICD-10-CM | POA: Diagnosis not present

## 2016-02-25 HISTORY — DX: Gastro-esophageal reflux disease without esophagitis: K21.9

## 2016-03-16 DIAGNOSIS — I509 Heart failure, unspecified: Secondary | ICD-10-CM | POA: Diagnosis not present

## 2016-03-16 DIAGNOSIS — E559 Vitamin D deficiency, unspecified: Secondary | ICD-10-CM | POA: Diagnosis not present

## 2016-03-16 DIAGNOSIS — E782 Mixed hyperlipidemia: Secondary | ICD-10-CM | POA: Diagnosis not present

## 2016-03-16 DIAGNOSIS — I10 Essential (primary) hypertension: Secondary | ICD-10-CM | POA: Diagnosis not present

## 2016-03-16 DIAGNOSIS — F325 Major depressive disorder, single episode, in full remission: Secondary | ICD-10-CM | POA: Diagnosis not present

## 2016-04-08 DIAGNOSIS — M542 Cervicalgia: Secondary | ICD-10-CM | POA: Diagnosis not present

## 2016-04-08 DIAGNOSIS — L308 Other specified dermatitis: Secondary | ICD-10-CM | POA: Diagnosis not present

## 2016-04-15 DIAGNOSIS — S80812A Abrasion, left lower leg, initial encounter: Secondary | ICD-10-CM | POA: Diagnosis not present

## 2016-05-20 DIAGNOSIS — M25512 Pain in left shoulder: Secondary | ICD-10-CM | POA: Diagnosis not present

## 2016-05-20 DIAGNOSIS — J0100 Acute maxillary sinusitis, unspecified: Secondary | ICD-10-CM | POA: Diagnosis not present

## 2016-05-20 DIAGNOSIS — J301 Allergic rhinitis due to pollen: Secondary | ICD-10-CM | POA: Diagnosis not present

## 2016-06-29 DIAGNOSIS — E782 Mixed hyperlipidemia: Secondary | ICD-10-CM | POA: Diagnosis not present

## 2016-06-29 DIAGNOSIS — R5383 Other fatigue: Secondary | ICD-10-CM | POA: Diagnosis not present

## 2016-06-29 DIAGNOSIS — R600 Localized edema: Secondary | ICD-10-CM | POA: Diagnosis not present

## 2016-06-29 DIAGNOSIS — Z0001 Encounter for general adult medical examination with abnormal findings: Secondary | ICD-10-CM | POA: Diagnosis not present

## 2016-06-29 DIAGNOSIS — I10 Essential (primary) hypertension: Secondary | ICD-10-CM | POA: Diagnosis not present

## 2016-06-29 DIAGNOSIS — R0602 Shortness of breath: Secondary | ICD-10-CM | POA: Diagnosis not present

## 2016-07-11 DIAGNOSIS — S81811A Laceration without foreign body, right lower leg, initial encounter: Secondary | ICD-10-CM | POA: Diagnosis not present

## 2016-07-11 DIAGNOSIS — M79606 Pain in leg, unspecified: Secondary | ICD-10-CM | POA: Diagnosis not present

## 2016-07-11 DIAGNOSIS — Z23 Encounter for immunization: Secondary | ICD-10-CM | POA: Diagnosis not present

## 2016-07-11 DIAGNOSIS — M2450 Contracture, unspecified joint: Secondary | ICD-10-CM | POA: Diagnosis not present

## 2016-07-15 DIAGNOSIS — S80811S Abrasion, right lower leg, sequela: Secondary | ICD-10-CM | POA: Diagnosis not present

## 2016-07-15 DIAGNOSIS — S80811A Abrasion, right lower leg, initial encounter: Secondary | ICD-10-CM | POA: Diagnosis not present

## 2016-08-06 DIAGNOSIS — M545 Low back pain: Secondary | ICD-10-CM | POA: Diagnosis not present

## 2016-08-25 DIAGNOSIS — N6323 Unspecified lump in the left breast, lower outer quadrant: Secondary | ICD-10-CM | POA: Diagnosis not present

## 2016-09-02 DIAGNOSIS — M25551 Pain in right hip: Secondary | ICD-10-CM | POA: Diagnosis not present

## 2016-09-02 DIAGNOSIS — M545 Low back pain: Secondary | ICD-10-CM | POA: Diagnosis not present

## 2016-09-30 DIAGNOSIS — E782 Mixed hyperlipidemia: Secondary | ICD-10-CM | POA: Diagnosis not present

## 2016-09-30 DIAGNOSIS — F325 Major depressive disorder, single episode, in full remission: Secondary | ICD-10-CM | POA: Diagnosis not present

## 2016-09-30 DIAGNOSIS — Z23 Encounter for immunization: Secondary | ICD-10-CM | POA: Diagnosis not present

## 2016-09-30 DIAGNOSIS — I10 Essential (primary) hypertension: Secondary | ICD-10-CM | POA: Diagnosis not present

## 2016-09-30 DIAGNOSIS — E559 Vitamin D deficiency, unspecified: Secondary | ICD-10-CM | POA: Diagnosis not present

## 2016-09-30 DIAGNOSIS — I509 Heart failure, unspecified: Secondary | ICD-10-CM | POA: Diagnosis not present

## 2016-09-30 DIAGNOSIS — R5383 Other fatigue: Secondary | ICD-10-CM | POA: Diagnosis not present

## 2016-10-26 DIAGNOSIS — H26491 Other secondary cataract, right eye: Secondary | ICD-10-CM | POA: Diagnosis not present

## 2016-10-26 DIAGNOSIS — H401132 Primary open-angle glaucoma, bilateral, moderate stage: Secondary | ICD-10-CM | POA: Diagnosis not present

## 2016-10-26 DIAGNOSIS — H524 Presbyopia: Secondary | ICD-10-CM | POA: Diagnosis not present

## 2016-11-16 DIAGNOSIS — E034 Atrophy of thyroid (acquired): Secondary | ICD-10-CM | POA: Diagnosis not present

## 2016-11-16 DIAGNOSIS — M542 Cervicalgia: Secondary | ICD-10-CM | POA: Diagnosis not present

## 2016-11-16 DIAGNOSIS — D72828 Other elevated white blood cell count: Secondary | ICD-10-CM | POA: Diagnosis not present

## 2016-11-16 DIAGNOSIS — E038 Other specified hypothyroidism: Secondary | ICD-10-CM | POA: Diagnosis not present

## 2016-11-16 DIAGNOSIS — R5383 Other fatigue: Secondary | ICD-10-CM | POA: Diagnosis not present

## 2016-12-30 DIAGNOSIS — H9201 Otalgia, right ear: Secondary | ICD-10-CM | POA: Diagnosis not present

## 2016-12-30 DIAGNOSIS — J300 Vasomotor rhinitis: Secondary | ICD-10-CM | POA: Diagnosis not present

## 2016-12-30 DIAGNOSIS — K219 Gastro-esophageal reflux disease without esophagitis: Secondary | ICD-10-CM | POA: Diagnosis not present

## 2017-02-22 DIAGNOSIS — E038 Other specified hypothyroidism: Secondary | ICD-10-CM | POA: Diagnosis not present

## 2017-02-22 DIAGNOSIS — H01003 Unspecified blepharitis right eye, unspecified eyelid: Secondary | ICD-10-CM | POA: Diagnosis not present

## 2017-02-22 DIAGNOSIS — E782 Mixed hyperlipidemia: Secondary | ICD-10-CM | POA: Diagnosis not present

## 2017-02-22 DIAGNOSIS — I10 Essential (primary) hypertension: Secondary | ICD-10-CM | POA: Diagnosis not present

## 2017-02-22 DIAGNOSIS — E559 Vitamin D deficiency, unspecified: Secondary | ICD-10-CM | POA: Diagnosis not present

## 2017-03-08 DIAGNOSIS — H401132 Primary open-angle glaucoma, bilateral, moderate stage: Secondary | ICD-10-CM | POA: Diagnosis not present

## 2017-03-23 DIAGNOSIS — J0100 Acute maxillary sinusitis, unspecified: Secondary | ICD-10-CM | POA: Diagnosis not present

## 2017-04-11 DIAGNOSIS — M25552 Pain in left hip: Secondary | ICD-10-CM | POA: Diagnosis not present

## 2017-04-11 DIAGNOSIS — S0993XA Unspecified injury of face, initial encounter: Secondary | ICD-10-CM | POA: Diagnosis not present

## 2017-04-11 DIAGNOSIS — S79912A Unspecified injury of left hip, initial encounter: Secondary | ICD-10-CM | POA: Diagnosis not present

## 2017-04-11 DIAGNOSIS — I10 Essential (primary) hypertension: Secondary | ICD-10-CM | POA: Diagnosis not present

## 2017-04-13 DIAGNOSIS — K648 Other hemorrhoids: Secondary | ICD-10-CM | POA: Diagnosis not present

## 2017-05-26 DIAGNOSIS — F411 Generalized anxiety disorder: Secondary | ICD-10-CM | POA: Diagnosis not present

## 2017-05-26 DIAGNOSIS — E038 Other specified hypothyroidism: Secondary | ICD-10-CM | POA: Diagnosis not present

## 2017-05-26 DIAGNOSIS — E782 Mixed hyperlipidemia: Secondary | ICD-10-CM | POA: Diagnosis not present

## 2017-05-26 DIAGNOSIS — E559 Vitamin D deficiency, unspecified: Secondary | ICD-10-CM | POA: Diagnosis not present

## 2017-05-26 DIAGNOSIS — I10 Essential (primary) hypertension: Secondary | ICD-10-CM | POA: Diagnosis not present

## 2017-06-04 DIAGNOSIS — A0811 Acute gastroenteropathy due to Norwalk agent: Secondary | ICD-10-CM | POA: Diagnosis not present

## 2017-06-04 DIAGNOSIS — J309 Allergic rhinitis, unspecified: Secondary | ICD-10-CM | POA: Diagnosis not present

## 2017-07-14 DIAGNOSIS — J300 Vasomotor rhinitis: Secondary | ICD-10-CM | POA: Diagnosis not present

## 2017-07-14 DIAGNOSIS — J019 Acute sinusitis, unspecified: Secondary | ICD-10-CM | POA: Diagnosis not present

## 2017-07-14 DIAGNOSIS — K219 Gastro-esophageal reflux disease without esophagitis: Secondary | ICD-10-CM | POA: Diagnosis not present

## 2017-07-14 DIAGNOSIS — H9201 Otalgia, right ear: Secondary | ICD-10-CM | POA: Diagnosis not present

## 2017-07-20 DIAGNOSIS — I482 Chronic atrial fibrillation: Secondary | ICD-10-CM | POA: Diagnosis not present

## 2017-07-20 DIAGNOSIS — R079 Chest pain, unspecified: Secondary | ICD-10-CM | POA: Diagnosis not present

## 2017-07-20 DIAGNOSIS — E7800 Pure hypercholesterolemia, unspecified: Secondary | ICD-10-CM | POA: Diagnosis not present

## 2017-07-20 DIAGNOSIS — I499 Cardiac arrhythmia, unspecified: Secondary | ICD-10-CM | POA: Diagnosis not present

## 2017-07-20 DIAGNOSIS — I10 Essential (primary) hypertension: Secondary | ICD-10-CM | POA: Diagnosis not present

## 2017-07-20 DIAGNOSIS — R531 Weakness: Secondary | ICD-10-CM | POA: Diagnosis not present

## 2017-07-20 DIAGNOSIS — I4891 Unspecified atrial fibrillation: Secondary | ICD-10-CM | POA: Diagnosis not present

## 2017-07-25 DIAGNOSIS — M1990 Unspecified osteoarthritis, unspecified site: Secondary | ICD-10-CM

## 2017-07-25 DIAGNOSIS — F329 Major depressive disorder, single episode, unspecified: Secondary | ICD-10-CM | POA: Insufficient documentation

## 2017-07-25 DIAGNOSIS — K219 Gastro-esophageal reflux disease without esophagitis: Secondary | ICD-10-CM | POA: Insufficient documentation

## 2017-07-25 DIAGNOSIS — F32A Depression, unspecified: Secondary | ICD-10-CM

## 2017-07-25 DIAGNOSIS — F419 Anxiety disorder, unspecified: Secondary | ICD-10-CM

## 2017-07-25 HISTORY — DX: Unspecified osteoarthritis, unspecified site: M19.90

## 2017-07-25 HISTORY — DX: Depression, unspecified: F32.A

## 2017-07-25 HISTORY — DX: Anxiety disorder, unspecified: F41.9

## 2017-07-29 ENCOUNTER — Ambulatory Visit: Payer: Medicare Other | Admitting: Cardiology

## 2017-08-03 ENCOUNTER — Ambulatory Visit (INDEPENDENT_AMBULATORY_CARE_PROVIDER_SITE_OTHER): Payer: Medicare Other | Admitting: Cardiology

## 2017-08-03 ENCOUNTER — Encounter: Payer: Self-pay | Admitting: Cardiology

## 2017-08-03 VITALS — BP 130/50 | HR 60 | Ht <= 58 in | Wt 95.8 lb

## 2017-08-03 DIAGNOSIS — I480 Paroxysmal atrial fibrillation: Secondary | ICD-10-CM

## 2017-08-03 DIAGNOSIS — I119 Hypertensive heart disease without heart failure: Secondary | ICD-10-CM

## 2017-08-03 DIAGNOSIS — R54 Age-related physical debility: Secondary | ICD-10-CM | POA: Diagnosis not present

## 2017-08-03 DIAGNOSIS — I4819 Other persistent atrial fibrillation: Secondary | ICD-10-CM | POA: Insufficient documentation

## 2017-08-03 NOTE — Progress Notes (Signed)
Cardiology Office Note:    Date:  08/03/2017   ID:  Rita Chapman, DOB [birth year], MRN [id number]  PCP:  Doreen Salvage, PA-C  Cardiologist:  Norman Herrlich, MD   Referring MD: Doreen Salvage, PA-C  ASSESSMENT:    1. Paroxysmal atrial fibrillation (HCC)   2. Hypertensive heart disease without heart failure   3. Frail elderly    PLAN:    In order of problems listed above:  1. She has one single documented episode and at this time after discussion patient and daughter elected not to be anticoagulated and to continue beta blocker and hold antiarrhythmic therapy for clinical frequent recurrence. 2. Stable continue  Current treatment including beta blocker 3. Obvious from interacting with the patient, she would be at high risk for anticoagulation with falls  Next appointment 3 months   Medication Adjustments/Labs and Tests Ordered: Current medicines are reviewed at length with the patient today.  Concerns regarding medicines are outlined above.  Orders Placed This Encounter  Procedures  . EKG 12-Lead   No orders of the defined types were placed in this encounter.    Chief Complaint  Patient presents with  . New Patient (Initial Visit)    per [facility] to evaluate Afib    History of Present Illness:    Rita Chapman is a 83 y.o. female who is being seen today for the evaluation of atrial fibrillation, CHADS2 vasc of 4  with a recent [facility] ED  visit. for palpitation and dizziness at the request of Rita Chapman, Rita Hill, PA-C. ST discharge she remained in AF and was not initiated on an anticoagulant. Er daughter is present and relates that in 2015 she was captured in atrial fibrillation on a single occasion at [facility] and I'm unable to access those records. She did have a normal myocardial perfusion study at that time.since then she has frequent episodes of palpitation but has not been severe or sustained. The day she presented to the hospital her heart rate was in the range of 120 to 130 she  took an extra dose of carvedilol and felt weak. She presented to [facility] in rate controlled atrial fibrillation. She's not had a recurrent episode. She is very frail isolated and has falls. After discussion of the benefit and risi her daughter prefers not to be anticoagulated at this time and the patient agrees. Overall heart rate runs in the range of 50-60 bpm and unless she's having frequent episodes of atrial fibrillation at this time we will hold on antiarrhythmic therapy. We discussed an echocardiogram however the findings would not impact this decision and decided not to do it at the present. The daughter willan adapter for her Smart phone to record EKG for episodes and return in 3 months to reassess. If atrial fibrillation is occurring frequently then we will initiate low-dose flecanide.  She was seen in cardiology consultation in 2014 at time of hip fracture and surgery: ASSESSMENT AND PLAN:   The patient was seen today by Dr [place], the patient evaluated and the data reviewed.  82 year old female with no previous cardiac issues was admitted 5/29 from [facility] with a hip fracture. She is for surgery 5/30. Cardiology was consult for pauses.  Pauses: Data from telemetry, the ECG here and records from [place]. No significant pauses were seen. Telemetry is incomplete but shows SR. Will continue to follow. She is able to perform ADL's including vacuuming and mopping without SOB or chest pain.  No further workup prior to surgery is needed.  Principal Problem:   Hip fracture Active Problems:   Hypertension   Hypercholesterolemia   Osteoporosis  Co-Sign MD Patient seen and examined and history reviewed. Agree with above findings and plan. Pleasant 83 yo WF with history of HTN and HL presents with right hip fracture following a fall. Patient lost her balance stepping up on a curb. She did not black out. No known cardiac disease. She saw a cardiologist in [facility] in the past  and was told her heart was in excellent shape. On exam she is in no distress. No JVD. Lungs are clear. CV without murmur or gallop. Ecg shows NSR with mild nonspecific IVCD. No arrhythmias on monitor. Patient is cleared from cardiac standpoint to proceed with hip surgery. No active cardiac problems such as angina or CHF. Rhythm is normal. Need to continue home BP meds. No further cardiac work up needed.   Theron Arista [facility] 04/12/2013 5:49 PM  Past Medical History:  Diagnosis Date  . Acute blood loss anemia 04/16/2013  . Anxiety 07/25/2017  . Atrial fibrillation (HCC)   . Depression 07/25/2017  . Fever, unspecified 04/16/2013  . Fracture of femoral neck, right (HCC) 04/12/2013  . Fracture of hip (HCC) 04/12/2013  . GERD (gastroesophageal reflux disease)   . Glaucoma   . Hypercholesterolemia   . Hypertension   . Hyposmolality and/or hyponatremia 04/16/2013  . LBBB (left bundle branch block)   . Osteoporosis 04/12/2013  . Pre-operative cardiovascular examination 04/12/2013    Past Surgical History:  Procedure Laterality Date  . HERNIA REPAIR    . HIP ARTHROPLASTY Right 04/13/2013   Procedure: RIGHT HIP ARTHROPLASTY BIPOLAR ;  Surgeon: Eulas Post, MD;  Location: [facility] OR;  Service: Orthopedics;  Laterality: Right;  . TUMOR REMOVAL     Benign    Current Medications: Current Meds  Medication Sig  . amLODipine (NORVASC) 5 MG tablet Take 5 mg by mouth daily.  Marland Kitchen amoxicillin (AMOXIL) 500 MG tablet Take 500 mg by mouth 2 (two) times daily.  Marland Kitchen aspirin EC 81 MG tablet Take 81 mg by mouth daily. Only takes 1 time per week  . carvedilol (COREG) 3.125 MG tablet Take 3.125 mg by mouth 2 (two) times daily with a meal.  . ergocalciferol (VITAMIN D2) 50000 units capsule Take 50,000 Units by mouth once a week.  . furosemide (LASIX) 20 MG tablet Take 20 mg by mouth daily.  Marland Kitchen levothyroxine (SYNTHROID, LEVOTHROID) 25 MCG tablet Take 25 mcg by mouth daily before breakfast.  . loratadine (CLARITIN) 10 MG  tablet Take 10 mg by mouth daily.  Marland Kitchen losartan (COZAAR) 50 MG tablet Take 50 mg by mouth daily.  Marland Kitchen omeprazole (PRILOSEC) 40 MG capsule Take 40 mg by mouth daily.  Marland Kitchen oxazepam (SERAX) 15 MG capsule Take 15 mg by mouth 2 (two) times daily.   . polyethylene glycol (MIRALAX / GLYCOLAX) packet Take 17 g by mouth daily as needed.  . sertraline (ZOLOFT) 25 MG tablet Take 12.5 mg by mouth daily.   . simvastatin (ZOCOR) 20 MG tablet Take 20 mg by mouth every evening.  . timolol (BETIMOL) 0.25 % ophthalmic solution Place 1-2 drops into both eyes 2 (two) times daily.     Allergies:   Bactrim [sulfamethoxazole-trimethoprim]; Cefuroxime axetil; and Sulfa antibiotics   Social History   Social History  . Marital status: Widowed    Spouse name: N/A  . Number of children: N/A  . Years of education: N/A   Occupational History  . Retired  Social History Main Topics  . Smoking status: Former Smoker    Packs/day: 1.00    Years: 30.00  . Smokeless tobacco: Never Used  . Alcohol use No  . Drug use: No  . Sexual activity: No   Other Topics Concern  . None   Social History Narrative   Lives in an apartment alone. Family nearby and helps with care.     Family History: The patient's family history includes Heart Problems in her sister; Hypertension in her sister.  ROS:   ROS Please see the history of present illness.     All other systems reviewed and are negative.  EKGs/Labs/Other Studies Reviewed:    The following studies were reviewed today: [facility] ED records were reviewed prior to visit including labs, XRAY EKG. EKG:  EKG is  ordered today.  The ekg ordered today demonstrates sinus rhythm EKG 07/20/17: AF rate 103 BPM, poor R wave progression, diffuse ST and T abnormality Recent Labs:  CMP and CBC normal, CXR normal No results found for requested labs within last 8760 hours.  Recent Lipid Panel No results found for: CHOL, TRIG, HDL, CHOLHDL, VLDL, LDLCALC, LDLDIRECT  Physical Exam:     VS:  BP (!) 130/50 (BP Location: Left Arm, Patient Position: Sitting)   Pulse 60   Ht  (1.372 m)   Wt 95 lb 12.8 oz (43.5 kg)   BMI 23.10 kg/m     Wt Readings from Last 3 Encounters:  08/03/17 95 lb 12.8 oz (43.5 kg)  04/12/13 102 lb (46.3 kg)     GEN: frail  in no acute distress HEENT: Normal NECK: No JVD; No carotid bruits LYMPHATICS: No lymphadenopathy CARDIAC:  RRR, no murmurs, rubs, gallops RESPIRATORY:  Clear to auscultation without rales, wheezing or rhonchi  ABDOMEN: Soft, non-tender, non-distended MUSCULOSKELETAL:  No edema; No deformity  SKIN: Warm and dry NEUROLOGIC:  Alert and oriented x 3 PSYCHIATRIC:  Normal affect     Signed, Norman Herrlich, MD  08/03/2017 12:56 PM    [facility]

## 2017-08-03 NOTE — Patient Instructions (Addendum)
Medication Instructions:  Your physician recommends that you continue on your current medications as directed. Please refer to the Current Medication list given to you today.  Labwork: None  Testing/Procedures: You had an EKG today.  Follow-Up: Your physician recommends that you schedule a follow-up appointment in: 3 months.   Any Other Special Instructions Will Be Listed Below (If Applicable).     If you need a refill on your cardiac medications before your next appointment, please call your pharmacy.    KardiaMobile [email or web address]        FDA-cleared, clinical grade mobile EKG monitor: Lourena Simmonds is the most clinically-validated mobile EKG used by the world's leading cardiac care medical professionals With Basic service, know instantly if your heart rhythm is normal or if atrial fibrillation is detected, and email the last single EKG recording to yourself or your doctor Premium service, available for purchase through the Kardia app for $9.99 per month or $99 per year, includes unlimited history and storage of your EKG recordings, a monthly EKG summary report to share with your doctor, along with the ability to track your blood pressure, activity and weight Includes one KardiaMobile phone clip FREE SHIPPING: Standard delivery 1-3 business days. Orders placed by 11:00am PST will ship that afternoon. Otherwise, will ship next business day. All orders ship via [employer] from [place], [place]   No admission procedures for hospital encounter.  1. Avoid all over-the-counter antihistamines except Claritin/Loratadine and Zyrtec/Cetrizine. 2. Avoid all combination including cold sinus allergies flu decongestant and sleep medications 3. You can use Robitussin DM Mucinex and Mucinex DM for cough. 4. can use Tylenol aspirin ibuprofen and naproxen but no combinations such as sleep or sinus.

## 2017-08-05 ENCOUNTER — Telehealth: Payer: Self-pay | Admitting: Cardiology

## 2017-08-05 NOTE — Telephone Encounter (Signed)
Please call patient regarding the dosage of Carvedilol.Marland Kitchen

## 2017-08-05 NOTE — Telephone Encounter (Signed)
Spoke with daughter, Meriam Sprague. States her mom was confused on the aspirin dose. Verified patient is to take 81 mg aspirin daily. No further questions.

## 2017-09-06 DIAGNOSIS — H26491 Other secondary cataract, right eye: Secondary | ICD-10-CM | POA: Diagnosis not present

## 2017-09-06 DIAGNOSIS — H04123 Dry eye syndrome of bilateral lacrimal glands: Secondary | ICD-10-CM | POA: Diagnosis not present

## 2017-09-06 DIAGNOSIS — H401132 Primary open-angle glaucoma, bilateral, moderate stage: Secondary | ICD-10-CM | POA: Diagnosis not present

## 2017-09-12 DIAGNOSIS — M7551 Bursitis of right shoulder: Secondary | ICD-10-CM | POA: Diagnosis not present

## 2017-09-12 DIAGNOSIS — M19011 Primary osteoarthritis, right shoulder: Secondary | ICD-10-CM | POA: Diagnosis not present

## 2017-09-20 DIAGNOSIS — E782 Mixed hyperlipidemia: Secondary | ICD-10-CM | POA: Diagnosis not present

## 2017-09-20 DIAGNOSIS — E038 Other specified hypothyroidism: Secondary | ICD-10-CM | POA: Diagnosis not present

## 2017-09-20 DIAGNOSIS — Z23 Encounter for immunization: Secondary | ICD-10-CM | POA: Diagnosis not present

## 2017-09-20 DIAGNOSIS — I10 Essential (primary) hypertension: Secondary | ICD-10-CM | POA: Diagnosis not present

## 2017-09-20 DIAGNOSIS — F411 Generalized anxiety disorder: Secondary | ICD-10-CM | POA: Diagnosis not present

## 2017-09-20 DIAGNOSIS — E559 Vitamin D deficiency, unspecified: Secondary | ICD-10-CM | POA: Diagnosis not present

## 2017-10-28 ENCOUNTER — Telehealth: Payer: Self-pay | Admitting: Cardiology

## 2017-10-28 NOTE — Telephone Encounter (Signed)
Patient scheduled for 11/02/17 for 3 month follow up. If she is doing well and doesn't need to come back, when should I reschedule her?

## 2017-10-28 NOTE — Telephone Encounter (Signed)
Has an upcoming appt and wants to know if it's necessary to keep it

## 2017-10-28 NOTE — Telephone Encounter (Signed)
I yr

## 2017-10-28 NOTE — Telephone Encounter (Signed)
Patient is doing well, no complaints at this time. Recall placed for 1 year from last appointment, 07/2018. Advised to call if needs to be sooner. Verbalized understanding. No further questions.

## 2017-10-31 DIAGNOSIS — H26491 Other secondary cataract, right eye: Secondary | ICD-10-CM | POA: Diagnosis not present

## 2017-10-31 DIAGNOSIS — H401132 Primary open-angle glaucoma, bilateral, moderate stage: Secondary | ICD-10-CM | POA: Diagnosis not present

## 2017-11-02 ENCOUNTER — Ambulatory Visit: Payer: Medicare Other | Admitting: Cardiology

## 2017-11-17 DIAGNOSIS — K648 Other hemorrhoids: Secondary | ICD-10-CM | POA: Diagnosis not present

## 2017-12-22 DIAGNOSIS — F411 Generalized anxiety disorder: Secondary | ICD-10-CM | POA: Diagnosis not present

## 2017-12-22 DIAGNOSIS — R5383 Other fatigue: Secondary | ICD-10-CM | POA: Diagnosis not present

## 2017-12-22 DIAGNOSIS — E038 Other specified hypothyroidism: Secondary | ICD-10-CM | POA: Diagnosis not present

## 2017-12-22 DIAGNOSIS — I10 Essential (primary) hypertension: Secondary | ICD-10-CM | POA: Diagnosis not present

## 2018-01-24 DIAGNOSIS — M25511 Pain in right shoulder: Secondary | ICD-10-CM | POA: Diagnosis not present

## 2018-01-31 DIAGNOSIS — J00 Acute nasopharyngitis [common cold]: Secondary | ICD-10-CM | POA: Diagnosis not present

## 2018-02-13 DIAGNOSIS — R000 Tachycardia, unspecified: Secondary | ICD-10-CM | POA: Diagnosis not present

## 2018-02-13 DIAGNOSIS — J90 Pleural effusion, not elsewhere classified: Secondary | ICD-10-CM | POA: Diagnosis not present

## 2018-02-13 DIAGNOSIS — I499 Cardiac arrhythmia, unspecified: Secondary | ICD-10-CM | POA: Diagnosis not present

## 2018-02-13 DIAGNOSIS — I110 Hypertensive heart disease with heart failure: Secondary | ICD-10-CM | POA: Diagnosis not present

## 2018-02-13 DIAGNOSIS — I480 Paroxysmal atrial fibrillation: Secondary | ICD-10-CM | POA: Diagnosis not present

## 2018-02-13 DIAGNOSIS — I509 Heart failure, unspecified: Secondary | ICD-10-CM | POA: Diagnosis not present

## 2018-02-14 DIAGNOSIS — M7989 Other specified soft tissue disorders: Secondary | ICD-10-CM | POA: Diagnosis not present

## 2018-02-14 DIAGNOSIS — E7800 Pure hypercholesterolemia, unspecified: Secondary | ICD-10-CM | POA: Diagnosis not present

## 2018-02-14 DIAGNOSIS — I480 Paroxysmal atrial fibrillation: Secondary | ICD-10-CM | POA: Diagnosis not present

## 2018-02-14 DIAGNOSIS — I110 Hypertensive heart disease with heart failure: Secondary | ICD-10-CM | POA: Diagnosis not present

## 2018-02-14 DIAGNOSIS — Z79899 Other long term (current) drug therapy: Secondary | ICD-10-CM | POA: Diagnosis not present

## 2018-02-14 DIAGNOSIS — R0602 Shortness of breath: Secondary | ICD-10-CM | POA: Diagnosis not present

## 2018-02-14 DIAGNOSIS — R7303 Prediabetes: Secondary | ICD-10-CM | POA: Diagnosis present

## 2018-02-14 DIAGNOSIS — S79911A Unspecified injury of right hip, initial encounter: Secondary | ICD-10-CM | POA: Diagnosis not present

## 2018-02-14 DIAGNOSIS — M25551 Pain in right hip: Secondary | ICD-10-CM | POA: Diagnosis not present

## 2018-02-14 DIAGNOSIS — M1990 Unspecified osteoarthritis, unspecified site: Secondary | ICD-10-CM | POA: Diagnosis present

## 2018-02-14 DIAGNOSIS — G9589 Other specified diseases of spinal cord: Secondary | ICD-10-CM | POA: Diagnosis not present

## 2018-02-14 DIAGNOSIS — K219 Gastro-esophageal reflux disease without esophagitis: Secondary | ICD-10-CM | POA: Diagnosis not present

## 2018-02-14 DIAGNOSIS — J9601 Acute respiratory failure with hypoxia: Secondary | ICD-10-CM | POA: Diagnosis present

## 2018-02-14 DIAGNOSIS — I10 Essential (primary) hypertension: Secondary | ICD-10-CM | POA: Diagnosis not present

## 2018-02-14 DIAGNOSIS — I4891 Unspecified atrial fibrillation: Secondary | ICD-10-CM | POA: Diagnosis not present

## 2018-02-14 DIAGNOSIS — E785 Hyperlipidemia, unspecified: Secondary | ICD-10-CM | POA: Diagnosis present

## 2018-02-14 DIAGNOSIS — I5033 Acute on chronic diastolic (congestive) heart failure: Secondary | ICD-10-CM | POA: Diagnosis present

## 2018-02-14 DIAGNOSIS — I509 Heart failure, unspecified: Secondary | ICD-10-CM | POA: Diagnosis not present

## 2018-02-14 DIAGNOSIS — E039 Hypothyroidism, unspecified: Secondary | ICD-10-CM | POA: Diagnosis present

## 2018-02-14 DIAGNOSIS — J90 Pleural effusion, not elsewhere classified: Secondary | ICD-10-CM | POA: Diagnosis not present

## 2018-02-14 DIAGNOSIS — Z66 Do not resuscitate: Secondary | ICD-10-CM | POA: Diagnosis not present

## 2018-02-14 DIAGNOSIS — R54 Age-related physical debility: Secondary | ICD-10-CM | POA: Diagnosis not present

## 2018-02-14 DIAGNOSIS — I499 Cardiac arrhythmia, unspecified: Secondary | ICD-10-CM | POA: Diagnosis not present

## 2018-02-15 DIAGNOSIS — R54 Age-related physical debility: Secondary | ICD-10-CM

## 2018-02-15 DIAGNOSIS — I4891 Unspecified atrial fibrillation: Secondary | ICD-10-CM

## 2018-02-15 DIAGNOSIS — I509 Heart failure, unspecified: Secondary | ICD-10-CM

## 2018-02-15 DIAGNOSIS — R0602 Shortness of breath: Secondary | ICD-10-CM

## 2018-02-15 DIAGNOSIS — I10 Essential (primary) hypertension: Secondary | ICD-10-CM

## 2018-02-17 ENCOUNTER — Ambulatory Visit (INDEPENDENT_AMBULATORY_CARE_PROVIDER_SITE_OTHER): Payer: Medicare Other | Admitting: Cardiology

## 2018-02-17 ENCOUNTER — Encounter: Payer: Self-pay | Admitting: Cardiology

## 2018-02-17 VITALS — BP 110/50 | HR 99

## 2018-02-17 DIAGNOSIS — I110 Hypertensive heart disease with heart failure: Secondary | ICD-10-CM | POA: Diagnosis not present

## 2018-02-17 DIAGNOSIS — I481 Persistent atrial fibrillation: Secondary | ICD-10-CM | POA: Diagnosis not present

## 2018-02-17 DIAGNOSIS — I5032 Chronic diastolic (congestive) heart failure: Secondary | ICD-10-CM | POA: Diagnosis not present

## 2018-02-17 DIAGNOSIS — I4819 Other persistent atrial fibrillation: Secondary | ICD-10-CM

## 2018-02-17 MED ORDER — APIXABAN 2.5 MG PO TABS: 2.5000 mg | ORAL_TABLET | Freq: Two times a day (BID) | ORAL | 3 refills | Status: DC

## 2018-02-17 NOTE — Patient Instructions (Addendum)
Medication Instructions:  Your physician has recommended you make the following change in your medication:  STOP aspirin START apixaban (Eliquis) 2.5 mg twice daily  CHANGE furosemide. Take an extra 20 mg tablet of furosemide daily as needed.  Labwork: None  Testing/Procedures: You had an EKG today.  Follow-Up: Your physician recommends that you schedule a follow-up appointment in: 6 weeks.  Any Other Special Instructions Will Be Listed Below (If Applicable).     If you need a refill on your cardiac medications before your next appointment, please call your pharmacy.    Heart Failure  Weigh yourself every morning when you first wake up and record on a calender or note pad, bring this to your office visits. Using a pill tender can help with taking your medications consistently.  Limit your fluid intake to 2 liters daily  Limit your sodium intake to less than 2-3 grams daily. Ask if you need dietary teaching.  If you gain more than 3 pounds (from your dry weight ), double your dose of diuretic for the day.  If you gain more than 5 pounds (from your dry weight), double your dose of lasix and call your heart failure doctor.  Please do not smoke tobacco since it is very bad for your heart.  Please do not drink alcohol since it can worsen your heart failure.Also avoid OTC nonsteroidal drugs, such as advil, aleve and motrin.  Try to exercise for at least 30 minutes every day because this will help your heart be more efficient. You may be eligible for supervised cardiac rehab, ask your physician.   Ask if they will use the heat failure vest for lung water

## 2018-02-17 NOTE — Progress Notes (Signed)
Cardiology Office Note:    Date:  02/17/2018   ID:  Rita Chapman, DOB [birth year], MRN [id number]  PCP:  Doreen Salvage, PA-C  Cardiologist:  Norman Herrlich, MD   Referring MD: Doreen Salvage, PA-C  ASSESSMENT:    1. Chronic diastolic heart failure (HCC)   2. Persistent atrial fibrillation (HCC)   3. Hypertensive heart disease with chronic diastolic congestive heart failure (HCC)    PLAN:    In order of problems listed above:  1. She had a recent decompensation hospitalization and presently has no edema.  I asked her to continue her current diuretic and gave the family to discretion to give her extra doses as needed as her frailty precludes her being weighed.  They request to have a home heart failure program involved in her care measuring long water to try to avoid repeated hospitalizations. 2. Stable continue beta-blocker for rate control and she will start a reduced dose anticoagulant.  She is so frail that I would not consider elective cardioversion. 3. Stable blood pressure continue current treatment.  Next appointment 6 weeks   Medication Adjustments/Labs and Tests Ordered: Current medicines are reviewed at length with the patient today.  Concerns regarding medicines are outlined above.  Orders Placed This Encounter  Procedures  . EKG 12-Lead   Meds ordered this encounter  Medications  . apixaban (ELIQUIS) 2.5 MG TABS tablet    Sig: Take 1 tablet (2.5 mg total) by mouth 2 (two) times daily.    Dispense:  60 tablet    Refill:  3     Chief Complaint  Patient presents with  . Hospitalization Follow-up    to evaulate Afib  . Congestive Heart Failure  . Atrial Fibrillation    History of Present Illness:    Rita Chapman is a 84 y.o. female with a history of hypertension and atrial fibrillation who is being seen today for the evaluation of atrial fibrillation and heart failure after recent [facility] admission at the request of Doreen Salvage, PA-C. She has had a marked  deterioration recent hospitalization with rate uncontrolled atrial fibrillation and decompensated heart failure.  Although her heart failure is compensated she is very debilitated frail weak and she is on continuous oxygen.  The family is most interested in her being comfortable and have home health care arranged.  She is too weak to weigh any longer.  Past Medical History:  Diagnosis Date  . Acute blood loss anemia 04/16/2013  . Anxiety 07/25/2017  . Arthritis 07/25/2017  . Atrial fibrillation (HCC)   . Depression 07/25/2017  . Fever, unspecified 04/16/2013  . Fracture of femoral neck, right (HCC) 04/12/2013  . Fracture of hip (HCC) 04/12/2013  . GERD (gastroesophageal reflux disease)   . GERD without esophagitis 02/25/2016   Last Assessment & Plan:  Difficulty swallowing. Intermittent issue.  Chronic.  No weight loss.  She is on omeprazole 40 mg daily. EXAMINATION of the oropharynx reveals a probably 2 cm mucous retention cyst in the left tonsillar fossa.  Nonobstructive.  Indirect laryngoscopy is okay. PLAN: Reassured all looks okay.  Symptoms probably related to reflux.  Not bothering her enough to change treatment   . Glaucoma   . Hypercholesterolemia   . Hypertension   . Hypertensive heart disease   . Hyposmolality and/or hyponatremia 04/16/2013  . LBBB (left bundle branch block)   . Osteoporosis 04/12/2013  . Pre-operative cardiovascular examination 04/12/2013    Past Surgical History:  Procedure Laterality Date  . HERNIA REPAIR    .  HIP ARTHROPLASTY Right 04/13/2013   Procedure: RIGHT HIP ARTHROPLASTY BIPOLAR ;  Surgeon: Eulas PostJoshua P Landau, MD;  Location: [facility] OR;  Service: Orthopedics;  Laterality: Right;  . TUMOR REMOVAL     Benign    Current Medications: Current Meds  Medication Sig  . amLODipine (NORVASC) 5 MG tablet Take 5 mg by mouth daily.  Marland Kitchen. amoxicillin (AMOXIL) 500 MG tablet Take 500 mg by mouth 2 (two) times daily as needed.   . carvedilol (COREG) 3.125 MG tablet Take 3.125 mg by  mouth 2 (two) times daily with a meal.  . ergocalciferol (VITAMIN D2) 50000 units capsule Take 50,000 Units by mouth once a week.  . furosemide (LASIX) 20 MG tablet Take 20 mg by mouth daily. Take an extra tablet daily as needed  . ibuprofen (ADVIL,MOTRIN) 600 MG tablet Take 300 mg by mouth 3 (three) times daily as needed.   Marland Kitchen. levothyroxine (SYNTHROID, LEVOTHROID) 25 MCG tablet Take 25 mcg by mouth daily before breakfast.  . loratadine (CLARITIN) 10 MG tablet Take 10 mg by mouth daily as needed for allergies.   Marland Kitchen. losartan (COZAAR) 50 MG tablet Take 50 mg by mouth daily.  Marland Kitchen. omeprazole (PRILOSEC) 40 MG capsule Take 40 mg by mouth daily.  Marland Kitchen. oxazepam (SERAX) 15 MG capsule Take 15 mg by mouth 2 (two) times daily.   . OXYGEN Inhale into the lungs. 1 liter at all times  . polyethylene glycol (MIRALAX / GLYCOLAX) packet Take 17 g by mouth daily as needed.  . sertraline (ZOLOFT) 25 MG tablet Take 12.5 mg by mouth daily.   . simvastatin (ZOCOR) 20 MG tablet Take 20 mg by mouth every evening.  . timolol (BETIMOL) 0.25 % ophthalmic solution Place 1-2 drops into both eyes 2 (two) times daily.  . [DISCONTINUED] aspirin EC 81 MG tablet Take 81 mg by mouth daily. Only takes 1 time per week     Allergies:   Acetaminophen; Bactrim [sulfamethoxazole-trimethoprim]; Cefuroxime axetil; Gabapentin; and Sulfa antibiotics   Social History   Socioeconomic History  . Marital status: Widowed    Spouse name: Not on file  . Number of children: Not on file  . Years of education: Not on file  . Highest education level: Not on file  Occupational History  . Occupation: Retired  Engineer, productionocial Needs  . Financial resource strain: Not on file  . Food insecurity:    Worry: Not on file    Inability: Not on file  . Transportation needs:    Medical: Not on file    Non-medical: Not on file  Tobacco Use  . Smoking status: Former Smoker    Packs/day: 1.00    Years: 30.00    Pack years: 30.00  . Smokeless tobacco: Never Used    Substance and Sexual Activity  . Alcohol use: No  . Drug use: No  . Sexual activity: Never  Lifestyle  . Physical activity:    Days per week: Not on file    Minutes per session: Not on file  . Stress: Not on file  Relationships  . Social connections:    Talks on phone: Not on file    Gets together: Not on file    Attends religious service: Not on file    Active member of club or organization: Not on file    Attends meetings of clubs or organizations: Not on file    Relationship status: Not on file  Other Topics Concern  . Not on file  Social History Narrative  Lives in an apartment alone. Family nearby and helps with care.     Family History: The patient's family history includes Heart Problems in her sister; Hypertension in her sister.  ROS:   ROS Please see the history of present illness.     All other systems reviewed and are negative.  EKGs/Labs/Other Studies Reviewed:    The following studies were reviewed today: [facility] records reviewed prior to visit  EKG:  EKG is  ordered today.  The ekg ordered today demonstrates atrial fibrillation rate controlled  Echo 02/14/18: mild LVH EF 55-60% moderate LAE  Lower extremity venous duplex bilateral, no DVT  Recent Labs: 02/16/18 Hgb 10.9,Plt 126,000Cr 0.9, K 3.8, Pro  BNP 4430  No results found for requested labs within last 8760 hours.  Recent Lipid Panel No results found for: CHOL, TRIG, HDL, CHOLHDL, VLDL, LDLCALC, LDLDIRECT  Physical Exam:    VS:  BP (!) 110/50 (BP Location: Right Arm, Patient Position: Sitting, Cuff Size: Normal)   Pulse 99   SpO2 93%     Wt Readings from Last 3 Encounters:  08/03/17 95 lb 12.8 oz (43.5 kg)  04/12/13 102 lb (46.3 kg)     GEN:  Well nourished, well developed in no acute distress HEENT: Normal NECK: No JVD; No carotid bruits LYMPHATICS: No lymphadenopathy CARDIAC: Irr Irr variable s1 RRR, no murmurs, rubs, gallops RESPIRATORY:  Clear to auscultation  without rales, wheezing or rhonchi  ABDOMEN: Soft, non-tender, non-distended MUSCULOSKELETAL:  No edema; No deformity  SKIN: Warm and dry NEUROLOGIC:  Alert and oriented x 3 PSYCHIATRIC:  Normal affect     Signed, Norman Herrlich, MD  02/17/2018 5:00 PM    [facility]

## 2018-02-20 DIAGNOSIS — I5032 Chronic diastolic (congestive) heart failure: Secondary | ICD-10-CM | POA: Diagnosis not present

## 2018-02-20 DIAGNOSIS — Z79899 Other long term (current) drug therapy: Secondary | ICD-10-CM | POA: Diagnosis not present

## 2018-02-20 DIAGNOSIS — I10 Essential (primary) hypertension: Secondary | ICD-10-CM | POA: Diagnosis not present

## 2018-02-20 DIAGNOSIS — I4891 Unspecified atrial fibrillation: Secondary | ICD-10-CM | POA: Diagnosis not present

## 2018-02-22 DIAGNOSIS — W19XXXA Unspecified fall, initial encounter: Secondary | ICD-10-CM | POA: Diagnosis not present

## 2018-02-22 DIAGNOSIS — M25561 Pain in right knee: Secondary | ICD-10-CM | POA: Diagnosis not present

## 2018-02-22 DIAGNOSIS — M25551 Pain in right hip: Secondary | ICD-10-CM | POA: Diagnosis not present

## 2018-03-16 DIAGNOSIS — K219 Gastro-esophageal reflux disease without esophagitis: Secondary | ICD-10-CM | POA: Diagnosis not present

## 2018-03-16 DIAGNOSIS — R2681 Unsteadiness on feet: Secondary | ICD-10-CM | POA: Diagnosis not present

## 2018-03-16 DIAGNOSIS — I481 Persistent atrial fibrillation: Secondary | ICD-10-CM | POA: Diagnosis not present

## 2018-03-16 DIAGNOSIS — R1312 Dysphagia, oropharyngeal phase: Secondary | ICD-10-CM | POA: Diagnosis not present

## 2018-03-16 DIAGNOSIS — M6281 Muscle weakness (generalized): Secondary | ICD-10-CM | POA: Diagnosis not present

## 2018-03-16 DIAGNOSIS — I5032 Chronic diastolic (congestive) heart failure: Secondary | ICD-10-CM | POA: Diagnosis not present

## 2018-03-16 DIAGNOSIS — I110 Hypertensive heart disease with heart failure: Secondary | ICD-10-CM | POA: Diagnosis not present

## 2018-03-17 DIAGNOSIS — I110 Hypertensive heart disease with heart failure: Secondary | ICD-10-CM | POA: Diagnosis not present

## 2018-03-17 DIAGNOSIS — J9610 Chronic respiratory failure, unspecified whether with hypoxia or hypercapnia: Secondary | ICD-10-CM | POA: Diagnosis not present

## 2018-03-17 DIAGNOSIS — I481 Persistent atrial fibrillation: Secondary | ICD-10-CM | POA: Diagnosis not present

## 2018-03-17 DIAGNOSIS — M6281 Muscle weakness (generalized): Secondary | ICD-10-CM | POA: Diagnosis not present

## 2018-03-17 DIAGNOSIS — I5032 Chronic diastolic (congestive) heart failure: Secondary | ICD-10-CM | POA: Diagnosis not present

## 2018-03-17 DIAGNOSIS — R2681 Unsteadiness on feet: Secondary | ICD-10-CM | POA: Diagnosis not present

## 2018-03-17 DIAGNOSIS — R1312 Dysphagia, oropharyngeal phase: Secondary | ICD-10-CM | POA: Diagnosis not present

## 2018-03-20 DIAGNOSIS — M6281 Muscle weakness (generalized): Secondary | ICD-10-CM | POA: Diagnosis not present

## 2018-03-20 DIAGNOSIS — R2681 Unsteadiness on feet: Secondary | ICD-10-CM | POA: Diagnosis not present

## 2018-03-20 DIAGNOSIS — R1312 Dysphagia, oropharyngeal phase: Secondary | ICD-10-CM | POA: Diagnosis not present

## 2018-03-21 DIAGNOSIS — R2681 Unsteadiness on feet: Secondary | ICD-10-CM | POA: Diagnosis not present

## 2018-03-21 DIAGNOSIS — M6281 Muscle weakness (generalized): Secondary | ICD-10-CM | POA: Diagnosis not present

## 2018-03-21 DIAGNOSIS — R1312 Dysphagia, oropharyngeal phase: Secondary | ICD-10-CM | POA: Diagnosis not present

## 2018-03-22 DIAGNOSIS — R2681 Unsteadiness on feet: Secondary | ICD-10-CM | POA: Diagnosis not present

## 2018-03-22 DIAGNOSIS — K219 Gastro-esophageal reflux disease without esophagitis: Secondary | ICD-10-CM | POA: Diagnosis not present

## 2018-03-22 DIAGNOSIS — M6281 Muscle weakness (generalized): Secondary | ICD-10-CM | POA: Diagnosis not present

## 2018-03-22 DIAGNOSIS — R1312 Dysphagia, oropharyngeal phase: Secondary | ICD-10-CM | POA: Diagnosis not present

## 2018-03-23 ENCOUNTER — Emergency Department (HOSPITAL_COMMUNITY): Payer: Medicare Other

## 2018-03-23 ENCOUNTER — Emergency Department (HOSPITAL_COMMUNITY)
Admission: EM | Admit: 2018-03-23 | Discharge: 2018-03-23 | Disposition: A | Discharge status: Skilled Nursing Facility | Payer: Medicare Other | Attending: Emergency Medicine | Admitting: Emergency Medicine

## 2018-03-23 ENCOUNTER — Encounter (HOSPITAL_COMMUNITY): Payer: Self-pay | Admitting: Obstetrics and Gynecology

## 2018-03-23 ENCOUNTER — Other Ambulatory Visit: Payer: Self-pay

## 2018-03-23 DIAGNOSIS — Z79899 Other long term (current) drug therapy: Secondary | ICD-10-CM | POA: Diagnosis not present

## 2018-03-23 DIAGNOSIS — R1312 Dysphagia, oropharyngeal phase: Secondary | ICD-10-CM | POA: Diagnosis not present

## 2018-03-23 DIAGNOSIS — I5032 Chronic diastolic (congestive) heart failure: Secondary | ICD-10-CM | POA: Insufficient documentation

## 2018-03-23 DIAGNOSIS — I509 Heart failure, unspecified: Secondary | ICD-10-CM | POA: Diagnosis not present

## 2018-03-23 DIAGNOSIS — Z87891 Personal history of nicotine dependence: Secondary | ICD-10-CM | POA: Insufficient documentation

## 2018-03-23 DIAGNOSIS — R2681 Unsteadiness on feet: Secondary | ICD-10-CM | POA: Diagnosis not present

## 2018-03-23 DIAGNOSIS — I110 Hypertensive heart disease with heart failure: Secondary | ICD-10-CM | POA: Diagnosis not present

## 2018-03-23 DIAGNOSIS — Z7901 Long term (current) use of anticoagulants: Secondary | ICD-10-CM | POA: Insufficient documentation

## 2018-03-23 DIAGNOSIS — M2550 Pain in unspecified joint: Secondary | ICD-10-CM | POA: Diagnosis not present

## 2018-03-23 DIAGNOSIS — Z7401 Bed confinement status: Secondary | ICD-10-CM | POA: Diagnosis not present

## 2018-03-23 DIAGNOSIS — J189 Pneumonia, unspecified organism: Secondary | ICD-10-CM | POA: Diagnosis not present

## 2018-03-23 DIAGNOSIS — Z96641 Presence of right artificial hip joint: Secondary | ICD-10-CM | POA: Insufficient documentation

## 2018-03-23 DIAGNOSIS — R0602 Shortness of breath: Secondary | ICD-10-CM | POA: Diagnosis not present

## 2018-03-23 DIAGNOSIS — M6281 Muscle weakness (generalized): Secondary | ICD-10-CM | POA: Diagnosis not present

## 2018-03-23 DIAGNOSIS — J90 Pleural effusion, not elsewhere classified: Secondary | ICD-10-CM | POA: Diagnosis not present

## 2018-03-23 DIAGNOSIS — R05 Cough: Secondary | ICD-10-CM | POA: Diagnosis not present

## 2018-03-23 HISTORY — DX: Heart failure, unspecified: I50.9

## 2018-03-23 HISTORY — DX: Dependence on supplemental oxygen: Z99.81

## 2018-03-23 LAB — CBC WITH DIFFERENTIAL/PLATELET
BASOS PCT: 1 %
Basophils Absolute: 0 10*3/uL (ref 0.0–0.1)
Eosinophils Absolute: 0.1 10*3/uL (ref 0.0–0.7)
Eosinophils Relative: 3 %
HEMATOCRIT: 39 % (ref 36.0–46.0)
HEMOGLOBIN: 12.3 g/dL (ref 12.0–15.0)
LYMPHS ABS: 1.1 10*3/uL (ref 0.7–4.0)
Lymphocytes Relative: 30 %
MCH: 29.9 pg (ref 26.0–34.0)
MCHC: 31.5 g/dL (ref 30.0–36.0)
MCV: 94.9 fL (ref 78.0–100.0)
MONO ABS: 0.3 10*3/uL (ref 0.1–1.0)
Monocytes Relative: 8 %
NEUTROS ABS: 2.1 10*3/uL (ref 1.7–7.7)
NEUTROS PCT: 58 %
Platelets: 130 10*3/uL — ABNORMAL LOW (ref 150–400)
RBC: 4.11 MIL/uL (ref 3.87–5.11)
RDW: 13.2 % (ref 11.5–15.5)
WBC: 3.6 10*3/uL — ABNORMAL LOW (ref 4.0–10.5)

## 2018-03-23 LAB — BRAIN NATRIURETIC PEPTIDE: B Natriuretic Peptide: 405.5 pg/mL — ABNORMAL HIGH (ref 0.0–100.0)

## 2018-03-23 LAB — BASIC METABOLIC PANEL
Anion gap: 13 (ref 5–15)
BUN: 14 mg/dL (ref 6–20)
CALCIUM: 9.8 mg/dL (ref 8.9–10.3)
CO2: 33 mmol/L — AB (ref 22–32)
Chloride: 96 mmol/L — ABNORMAL LOW (ref 101–111)
Creatinine, Ser: 0.66 mg/dL (ref 0.44–1.00)
GFR calc non Af Amer: >60 mL/min (ref >60)
Glucose, Bld: 89 mg/dL (ref 65–99)
Potassium: 3.8 mmol/L (ref 3.5–5.1)
SODIUM: 142 mmol/L (ref 135–145)

## 2018-03-23 LAB — I-STAT TROPONIN, ED: TROPONIN I, POC: 0.02 ng/mL (ref 0.00–0.08)

## 2018-03-23 MED ORDER — FUROSEMIDE 10 MG/ML IJ SOLN
40.0000 mg | Freq: Once | INTRAMUSCULAR | Status: AC
Start: 2018-03-23 — End: 2018-03-23
  Administered 2018-03-23: 40 mg via INTRAVENOUS
  Filled 2018-03-23: qty 4

## 2018-03-23 NOTE — Discharge Instructions (Addendum)
Please follow up with your cardiologist °Return if worsening °

## 2018-03-23 NOTE — ED Notes (Signed)
ED Provider at bedside.BUTLER 

## 2018-03-23 NOTE — ED Triage Notes (Signed)
Per EMS: Pt is coming from [facility] in [place]. Pt's daughter insisted that she come to the hospital because she had a "touch of pneumonia" on her chest x-ray.  Pt has no complaints at this time. Facility offered to treat her there, but daughter insisted she come to the ER.  Pt currently on 1L O2 via nasal canula.  Mobile x-ray used to say it was pneumonia.  Pt is a DNR and paperwork at bedside.  Hx of afib, CHF, HTN, Hypothyroidism, and GERD.

## 2018-03-23 NOTE — ED Notes (Signed)
NO RX GIVEN 

## 2018-03-23 NOTE — ED Notes (Signed)
DNR YELLOW COPY PRESENT

## 2018-03-23 NOTE — ED Notes (Signed)
Bed: [facility]23 Expected date:  Expected time:  Means of arrival:  Comments: HALL C  

## 2018-03-23 NOTE — ED Notes (Signed)
EDPA Provider at bedside. 

## 2018-03-23 NOTE — ED Notes (Signed)
Bed: [facility] Expected date:  Expected time:  Means of arrival:  Comments: EMS pneumonia

## 2018-03-23 NOTE — ED Provider Notes (Signed)
[facility]-EMERGENCY DEPT Provider Note   CSN: [id number] Arrival date & time: 03/23/18  1239     History   Chief Complaint Chief Complaint  Patient presents with  . Cough    HPI Rita Chapman is a 84 y.o. female who presents with shortness of breath.  Past medical history significant for A. fib on Eliquis, CHF, hypertension, GERD.  She currently resides at [facility] nursing [facility].  She was discharged from [facility] last week and has been staying there.  She has been complaining of "smothering" and shortness of breath.  This is been ongoing for the past month.  She was hospitalized for her CHF and her granddaughter at bedside says she is been on oxygen ever since her hospital stay.  She is currently on 1 L of oxygen.  She is been taking her fluid medication.  Today she was very short of breath and told the nursing staff to obtain a chest x-ray and told her she had pneumonia.  Family wanted her to be transported to the hospital because they do not trust the rehab center.  The patient has not had a fever, chest pain, significant cough, abdominal pain, nausea, vomiting.  She does report increased swelling in her legs.  HPI  Past Medical History:  Diagnosis Date  . Acute blood loss anemia 04/16/2013  . Anxiety 07/25/2017  . Arthritis 07/25/2017  . Atrial fibrillation (HCC)   . CHF (congestive heart failure) (HCC)   . Depression 07/25/2017  . Fever, unspecified 04/16/2013  . Fracture of femoral neck, right (HCC) 04/12/2013  . Fracture of hip (HCC) 04/12/2013  . GERD (gastroesophageal reflux disease)   . GERD without esophagitis 02/25/2016   Last Assessment & Plan:  Difficulty swallowing. Intermittent issue.  Chronic.  No weight loss.  She is on omeprazole 40 mg daily. EXAMINATION of the oropharynx reveals a probably 2 cm mucous retention cyst in the left tonsillar fossa.  Nonobstructive.  Indirect laryngoscopy is okay. PLAN: Reassured all looks okay.  Symptoms  probably related to reflux.  Not bothering her enough to change treatment   . Glaucoma   . Hypercholesterolemia   . Hypertension   . Hypertensive heart disease   . Hyposmolality and/or hyponatremia 04/16/2013  . LBBB (left bundle branch block)   . Osteoporosis 04/12/2013  . Oxygen dependent   . Pre-operative cardiovascular examination 04/12/2013    Patient Active Problem List   Diagnosis Date Noted  . Chronic diastolic heart failure (HCC) 02/17/2018  . Atrial fibrillation (HCC) 08/03/2017  . GERD (gastroesophageal reflux disease) 07/25/2017  . Arthritis 07/25/2017  . Anxiety 07/25/2017  . Depression 07/25/2017  . GERD without esophagitis 02/25/2016  . Acute blood loss anemia 04/16/2013  . Hyposmolality and/or hyponatremia 04/16/2013  . Fever, unspecified 04/16/2013  . Osteoporosis 04/12/2013  . Pre-operative cardiovascular examination 04/12/2013  . Hypertensive heart disease   . Hypercholesterolemia     Past Surgical History:  Procedure Laterality Date  . HERNIA REPAIR    . HIP ARTHROPLASTY Right 04/13/2013   Procedure: RIGHT HIP ARTHROPLASTY BIPOLAR ;  Surgeon: Eulas Post, MD;  Location: [facility] OR;  Service: Orthopedics;  Laterality: Right;  . TUMOR REMOVAL     Benign     OB History   None      Home Medications    Prior to Admission medications   Medication Sig Start Date End Date Taking? Authorizing Provider  amLODipine (NORVASC) 5 MG tablet Take 5 mg by mouth daily.  Yes [provider]  apixaban (ELIQUIS) 5 MG TABS tablet Take 5 mg by mouth 2 (two) times daily.   Yes [provider]  carvedilol (COREG) 3.125 MG tablet Take 3.125 mg by mouth 2 (two) times daily with a meal.   Yes [provider]  ergocalciferol (VITAMIN D2) 50000 units capsule Take 50,000 Units by mouth once a week.   Yes [provider]  fluticasone (FLONASE) 50 MCG/ACT nasal spray Place 2 sprays into both nostrils 2 (two) times daily. 01/03/17  Yes [provider]  furosemide (LASIX) 20 MG tablet Take 20 mg by mouth daily. Take an extra tablet daily as needed   Yes [provider]  levothyroxine (SYNTHROID, LEVOTHROID) 25 MCG tablet Take 25 mcg by mouth daily before breakfast.   Yes [provider]  loratadine (CLARITIN) 10 MG tablet Take 10 mg by mouth daily.    Yes [provider]  losartan (COZAAR) 50 MG tablet Take 25 mg by mouth daily.    Yes [provider]  omeprazole (PRILOSEC) 40 MG capsule Take 40 mg by mouth daily.   Yes [provider]  oxazepam (SERAX) 15 MG capsule Take 15 mg by mouth 2 (two) times daily.    Yes [provider]  OXYGEN Inhale into the lungs. 1 liter at all times   Yes [provider]  sertraline (ZOLOFT) 25 MG tablet Take 12.5 mg by mouth daily.    Yes [provider]  simvastatin (ZOCOR) 20 MG tablet Take 20 mg by mouth at bedtime.    Yes [provider]  timolol (BETIMOL) 0.25 % ophthalmic solution Place 2 drops into both eyes 2 (two) times daily.    Yes [provider]  apixaban (ELIQUIS) 2.5 MG TABS tablet Take 1 tablet (2.5 mg total) by mouth 2 (two) times daily. Patient not taking: Reported on 03/23/2018 02/17/18   Baldo Daub, MD  polyethylene glycol [facility] Ethelene Hal) packet Take 17 g by mouth daily as needed. Patient not taking: Reported on 03/23/2018 04/17/13   Kathlen Mody, MD    Family History Family History  Problem Relation Age of Onset  . Hypertension Sister   . Heart Problems Sister     Social History Social History   Tobacco Use  . Smoking status: Former Smoker    Packs/day: 1.00    Years: 30.00    Pack years: 30.00  . Smokeless tobacco: Never Used  Substance Use Topics  . Alcohol use: No  . Drug use: No     Allergies   Acetaminophen; Bactrim [sulfamethoxazole-trimethoprim]; Cefuroxime axetil; Gabapentin; and Sulfa antibiotics   Review of Systems Review of Systems  Constitutional:  Negative for fever.  Respiratory: Positive for shortness of breath. Negative for cough.   Cardiovascular: Positive for leg swelling. Negative for chest pain.  Gastrointestinal: Negative for abdominal pain, nausea and vomiting.  All other systems reviewed and are negative.    Physical Exam Updated Vital Signs BP 110/76 (BP Location: Left Arm)   Pulse 76   Temp 97.7 F (36.5 C) (Oral)   Resp 16   SpO2 97%   Physical Exam  Constitutional: She is oriented to person, place, and time. She appears well-developed and well-nourished. No distress.  Frail, chronically ill-appearing female in no acute distress  HENT:  Head: Normocephalic and atraumatic.  Eyes: Pupils are equal, round, and reactive to light. Conjunctivae are normal. Right eye exhibits no discharge. Left eye exhibits no discharge. No scleral icterus.  Neck: Normal range of motion.  Cardiovascular: Normal rate. An irregularly irregular rhythm present.  Pulmonary/Chest: Effort normal. No respiratory distress.  Bibasilar crackles  Abdominal: She exhibits no distension.  Musculoskeletal:  1+ bilateral peripheral edema.  Neurological: She is alert and oriented to person, place, and time.  Skin: Skin is warm and dry.  Psychiatric: She has a normal mood and affect. Her behavior is normal.  Nursing note and vitals reviewed.    ED Treatments / Results  Labs (all labs ordered are listed, but only abnormal results are displayed) Labs Reviewed  BASIC METABOLIC PANEL - Abnormal; Notable for the following components:      Result Value   Chloride 96 (*)    CO2 33 (*)    All other components within normal limits  BRAIN NATRIURETIC PEPTIDE - Abnormal; Notable for the following components:   B Natriuretic Peptide 405.5 (*)    All other components within normal limits  CBC WITH DIFFERENTIAL/PLATELET - Abnormal; Notable for the following components:   WBC 3.6 (*)    Platelets 130 (*)    All other components within normal limits    I-STAT TROPONIN, ED    EKG EKG Interpretation  Date/Time:  Thursday Mar 23 2018 14:10:30 EDT Ventricular Rate:  94 PR Interval:    QRS Duration: 127 QT Interval:  361 QTC Calculation: 447 R Axis:   7 Text Interpretation:  Atrial fibrillation IVCD, consider atypical LBBB Artifact in lead(s) I II III aVR aVL aVF similar pattern to prior 5/14 other than now in afib Confirmed by Meridee Score [phone]) on 03/23/2018 2:26:26 PM   Radiology Dg Chest 2 View  Result Date: 03/23/2018 CLINICAL DATA:  Cough and congestion with shortness of breath. EXAM: CHEST - 2 VIEW COMPARISON:  Chest x-ray dated February 13, 2018. FINDINGS: Mild cardiomegaly. Normal pulmonary vascularity. Small left and trace right pleural effusions. Left lower lobe atelectasis. No consolidation or pneumothorax. No acute osseous abnormality. Unchanged mild compression deformity of T12. IMPRESSION: Small left and trace right pleural effusions. Electronically Signed   By: Obie Dredge M.D.   On: 03/23/2018 13:48    Procedures Procedures (including critical care time)  Medications Ordered in ED Medications  furosemide (LASIX) injection 40 mg (has no administration in time range)     Initial Impression / Assessment and Plan / ED Course  I have reviewed the triage vital signs and the nursing notes.  Pertinent labs & imaging results that were available during my care of the patient were reviewed by me and considered in my medical decision making (see chart for details).  Clinical Course as of Mar 24 1447  Thu Mar 23, 2018  1434 Pleasant 84 year old female here with cough for a couple days and worsening shortness of breath today.  Reportedly at the facility they get a chest x-ray and told her she had pneumonia.  She is feeling somewhat better since arrival here.  She is nontoxic-appearing.  We are getting some lab work chest x-ray EKG and will go from there.   [MB]    Clinical Course User Index [MB] Terrilee Files, MD     84 year old female presents with shortness of breath.  This is a chronic problem for her but seems to be a little bit worse today.  Her vital signs are normal.  She is not in any distress.  Her heart rate is normal and her rhythm is irregular.  She has faint bibasilar crackles on lung exam.  She has mild peripheral  edema.  Her EKG shows rate controlled A. fib.  Her chest x-ray shows a small left pleural effusion and trace right pleural effusion.  Her CBC is unremarkable.  Her BMP is normal.  Her BNP is elevated at 405.  No prior value to compare this to.  Her troponin is normal.  This is a shared visit with Dr. Charm Barges.  Plan is to give her an extra dose of Lasix and have her transported back to [facility].  She was advised to follow-up with her cardiologist regarding medication adjustments for her chronic shortness of breath.  Final Clinical Impressions(s) / ED Diagnoses   Final diagnoses:  Pleural effusion  Congestive heart failure, unspecified HF chronicity, unspecified heart failure type [facility])    ED Discharge Orders    None       Bethel Born, PA-C 03/23/18 1647    Terrilee Files, MD 03/23/18 [phone]

## 2018-03-24 DIAGNOSIS — I481 Persistent atrial fibrillation: Secondary | ICD-10-CM | POA: Diagnosis not present

## 2018-03-24 DIAGNOSIS — R1312 Dysphagia, oropharyngeal phase: Secondary | ICD-10-CM | POA: Diagnosis not present

## 2018-03-24 DIAGNOSIS — R2681 Unsteadiness on feet: Secondary | ICD-10-CM | POA: Diagnosis not present

## 2018-03-24 DIAGNOSIS — I5032 Chronic diastolic (congestive) heart failure: Secondary | ICD-10-CM | POA: Diagnosis not present

## 2018-03-24 DIAGNOSIS — I110 Hypertensive heart disease with heart failure: Secondary | ICD-10-CM | POA: Diagnosis not present

## 2018-03-24 DIAGNOSIS — K219 Gastro-esophageal reflux disease without esophagitis: Secondary | ICD-10-CM | POA: Diagnosis not present

## 2018-03-24 DIAGNOSIS — M6281 Muscle weakness (generalized): Secondary | ICD-10-CM | POA: Diagnosis not present

## 2018-03-27 DIAGNOSIS — R2681 Unsteadiness on feet: Secondary | ICD-10-CM | POA: Diagnosis not present

## 2018-03-27 DIAGNOSIS — M6281 Muscle weakness (generalized): Secondary | ICD-10-CM | POA: Diagnosis not present

## 2018-03-27 DIAGNOSIS — R1312 Dysphagia, oropharyngeal phase: Secondary | ICD-10-CM | POA: Diagnosis not present

## 2018-03-28 DIAGNOSIS — R1312 Dysphagia, oropharyngeal phase: Secondary | ICD-10-CM | POA: Diagnosis not present

## 2018-03-28 DIAGNOSIS — M6281 Muscle weakness (generalized): Secondary | ICD-10-CM | POA: Diagnosis not present

## 2018-03-28 DIAGNOSIS — R2681 Unsteadiness on feet: Secondary | ICD-10-CM | POA: Diagnosis not present

## 2018-03-29 DIAGNOSIS — M6281 Muscle weakness (generalized): Secondary | ICD-10-CM | POA: Diagnosis not present

## 2018-03-29 DIAGNOSIS — R1319 Other dysphagia: Secondary | ICD-10-CM | POA: Diagnosis not present

## 2018-03-29 DIAGNOSIS — R488 Other symbolic dysfunctions: Secondary | ICD-10-CM | POA: Diagnosis not present

## 2018-03-29 DIAGNOSIS — R2681 Unsteadiness on feet: Secondary | ICD-10-CM | POA: Diagnosis not present

## 2018-03-29 DIAGNOSIS — J189 Pneumonia, unspecified organism: Secondary | ICD-10-CM | POA: Diagnosis not present

## 2018-03-29 DIAGNOSIS — K219 Gastro-esophageal reflux disease without esophagitis: Secondary | ICD-10-CM | POA: Diagnosis not present

## 2018-03-29 DIAGNOSIS — R1312 Dysphagia, oropharyngeal phase: Secondary | ICD-10-CM | POA: Diagnosis not present

## 2018-03-29 DIAGNOSIS — R05 Cough: Secondary | ICD-10-CM | POA: Diagnosis not present

## 2018-03-29 DIAGNOSIS — R0989 Other specified symptoms and signs involving the circulatory and respiratory systems: Secondary | ICD-10-CM | POA: Diagnosis not present

## 2018-03-30 DIAGNOSIS — K219 Gastro-esophageal reflux disease without esophagitis: Secondary | ICD-10-CM | POA: Diagnosis not present

## 2018-03-30 DIAGNOSIS — R2681 Unsteadiness on feet: Secondary | ICD-10-CM | POA: Diagnosis not present

## 2018-03-30 DIAGNOSIS — M6281 Muscle weakness (generalized): Secondary | ICD-10-CM | POA: Diagnosis not present

## 2018-03-30 DIAGNOSIS — H6502 Acute serous otitis media, left ear: Secondary | ICD-10-CM | POA: Diagnosis not present

## 2018-03-30 DIAGNOSIS — R1312 Dysphagia, oropharyngeal phase: Secondary | ICD-10-CM | POA: Diagnosis not present

## 2018-03-30 NOTE — Progress Notes (Signed)
Cardiology Office Note:    Date:  03/31/2018   ID:  Rita Chapman, DOB [birth year], MRN [id number]  PCP:  Doreen Salvage, PA-C  Cardiologist:  Norman Herrlich, MD    Referring MD: Doreen Salvage, PA-C    ASSESSMENT:    1. Chronic diastolic heart failure (HCC)   2. Persistent atrial fibrillation (HCC)   3. Chronic anticoagulation    PLAN:    In order of problems listed above:  1. Worsen she will increase her dose of loop diuretic furosemide 20 mg twice daily.  With low blood pressure calcium channel blocker amlodipine will be discontinued. 2. Rate controlled continue beta-blocker and reduced dose anticoagulant 3. Stable continue anticoagulant   Next appointment: 3 months   Medication Adjustments/Labs and Tests Ordered: Current medicines are reviewed at length with the patient today.  Concerns regarding medicines are outlined above.  No orders of the defined types were placed in this encounter.  No orders of the defined types were placed in this encounter.   Chief Complaint  Patient presents with  . Follow-up    History of Present Illness:    Rita Chapman is a 84 y.o. female with a hx of heart failure, hypertension and atrial fibrillation last seen 02/17/18. She was recently seen at [facility] ED with decompensated heart failure and given furosemide and discharged home.  ASSESSMENT:    02/17/18   1. Chronic diastolic heart failure (HCC)   2. Persistent atrial fibrillation (HCC)   3. Hypertensive heart disease with chronic diastolic congestive heart failure (HCC)    PLAN:    In order of problems listed above:  1.       She had a recent decompensation hospitalization and presently has no edema.  I asked her to continue her current diuretic and gave the family to discretion to give her extra doses as needed as her frailty precludes her being weighed.  They request to have a home heart failure program involved in her care measuring lung water to try to avoid repeated  hospitalizations. 4. Stable continue beta-blocker for rate control and she will start a reduced dose anticoagulant.  She is so frail that I would     not consider elective cardioversion. 3.       Stable blood pressure continue current treatment.  Compliance with diet, lifestyle and medications: Yes  She is increasingly frail had an episode of shortness of breath and had mildly decompensated heart failure.  She cannot control her dietary sodium in a skilled nursing facility so increased dose of her loop diuretic.  She is losing weight having trouble swallowing eating and has reflux.  Presently no chest pain shortness of breath palpitation or syncope. Past Medical History:  Diagnosis Date  . Acute blood loss anemia 04/16/2013  . Anxiety 07/25/2017  . Arthritis 07/25/2017  . Atrial fibrillation (HCC)   . CHF (congestive heart failure) (HCC)   . Depression 07/25/2017  . Fever, unspecified 04/16/2013  . Fracture of femoral neck, right (HCC) 04/12/2013  . Fracture of hip (HCC) 04/12/2013  . GERD (gastroesophageal reflux disease)   . GERD without esophagitis 02/25/2016   Last Assessment & Plan:  Difficulty swallowing. Intermittent issue.  Chronic.  No weight loss.  She is on omeprazole 40 mg daily. EXAMINATION of the oropharynx reveals a probably 2 cm mucous retention cyst in the left tonsillar fossa.  Nonobstructive.  Indirect laryngoscopy is okay. PLAN: Reassured all looks okay.  Symptoms probably related to reflux.  Not bothering her enough to  change treatment   . Glaucoma   . Hypercholesterolemia   . Hypertension   . Hypertensive heart disease   . Hyposmolality and/or hyponatremia 04/16/2013  . LBBB (left bundle branch block)   . Osteoporosis 04/12/2013  . Oxygen dependent   . Pre-operative cardiovascular examination 04/12/2013    Past Surgical History:  Procedure Laterality Date  . HERNIA REPAIR    . HIP ARTHROPLASTY Right 04/13/2013   Procedure: RIGHT HIP ARTHROPLASTY BIPOLAR ;  Surgeon: Eulas Post, MD;  Location: [facility] OR;  Service: Orthopedics;  Laterality: Right;  . TUMOR REMOVAL     Benign    Current Medications: Current Meds  Medication Sig  . amLODipine (NORVASC) 5 MG tablet Take 5 mg by mouth daily.  Marland Kitchen apixaban (ELIQUIS) 5 MG TABS tablet Take 5 mg by mouth 2 (two) times daily.  . carvedilol (COREG) 3.125 MG tablet Take 3.125 mg by mouth 2 (two) times daily with a meal.  . ergocalciferol (VITAMIN D2) 50000 units capsule Take 50,000 Units by mouth once a week.  . fluticasone (FLONASE) 50 MCG/ACT nasal spray Place 2 sprays into both nostrils 2 (two) times daily.  . furosemide (LASIX) 20 MG tablet Take 20 mg by mouth daily. Take an extra tablet daily as needed  . levothyroxine (SYNTHROID, LEVOTHROID) 25 MCG tablet Take 25 mcg by mouth daily before breakfast.  . loratadine (CLARITIN) 10 MG tablet Take 10 mg by mouth daily.   Marland Kitchen losartan (COZAAR) 50 MG tablet Take 25 mg by mouth daily.   Marland Kitchen omeprazole (PRILOSEC) 40 MG capsule Take 40 mg by mouth daily.  Marland Kitchen oxazepam (SERAX) 15 MG capsule Take 15 mg by mouth 2 (two) times daily.   . OXYGEN Inhale into the lungs. 1 liter at all times  . sertraline (ZOLOFT) 25 MG tablet Take 25 mg by mouth daily.   . simvastatin (ZOCOR) 20 MG tablet Take 20 mg by mouth at bedtime.   . timolol (BETIMOL) 0.25 % ophthalmic solution Place 2 drops into both eyes 2 (two) times daily.      Allergies:   Acetaminophen; Bactrim [sulfamethoxazole-trimethoprim]; Cefuroxime axetil; Gabapentin; and Sulfa antibiotics   Social History   Socioeconomic History  . Marital status: Widowed    Spouse name: Not on file  . Number of children: Not on file  . Years of education: Not on file  . Highest education level: Not on file  Occupational History  . Occupation: Retired  Engineer, production  . Financial resource strain: Not on file  . Food insecurity:    Worry: Not on file    Inability: Not on file  . Transportation needs:    Medical: Not on file    Non-medical:  Not on file  Tobacco Use  . Smoking status: Former Smoker    Packs/day: 1.00    Years: 30.00    Pack years: 30.00  . Smokeless tobacco: Never Used  Substance and Sexual Activity  . Alcohol use: No  . Drug use: No  . Sexual activity: Never  Lifestyle  . Physical activity:    Days per week: Not on file    Minutes per session: Not on file  . Stress: Not on file  Relationships  . Social connections:    Talks on phone: Not on file    Gets together: Not on file    Attends religious service: Not on file    Active member of club or organization: Not on file    Attends meetings of  clubs or organizations: Not on file    Relationship status: Not on file  Other Topics Concern  . Not on file  Social History Narrative   Lives in an apartment alone. Family nearby and helps with care.     Family History: The patient's family history includes Heart Problems in her sister; Hypertension in her sister. ROS:   Please see the history of present illness.    All other systems reviewed and are negative.  EKGs/Labs/Other Studies Reviewed:    The following studies were reviewed today:  EKG:  03/23/18 showed atrial fibrillation and a controlled rate, NSIVCD  CXR 03/23/18: FINDINGS: Mild cardiomegaly. Normal pulmonary vascularity. Small left and trace right pleural effusions. Left lower lobe atelectasis. No consolidation or pneumothorax. No acute osseous abnormality. Unchanged mild compression deformity of T12. IMPRESSION: Small left and trace right pleural effusions. Seen on CT chest April 2019  Recent Labs: 03/23/2018: B Natriuretic Peptide 405.5; BUN 14; Creatinine, Ser 0.66; Hemoglobin 12.3; Platelets 130; Potassium 3.8; Sodium 142    Physical Exam:    VS:  BP (!) 106/54 (BP Location: Right Arm, Patient Position: Sitting, Cuff Size: Normal)   Pulse 81   Ht  (1.372 m)   Wt 94 lb (42.6 kg) Comment: pt in wheelchair  SpO2 98%   BMI 22.66 kg/m     Wt Readings from Last 3  Encounters:  03/31/18 94 lb (42.6 kg)  03/23/18 95 lb (43.1 kg)  08/03/17 95 lb 12.8 oz (43.5 kg)     GEN: Frail  in no acute distress HEENT: Normal NECK: No JVD; No carotid bruits LYMPHATICS: No lymphadenopathy CARDIAC: IrrIrr no S3 RESPIRATORY:  Clear to auscultation without rales, wheezing or rhonchi  ABDOMEN: Soft, non-tender, non-distended MUSCULOSKELETAL:  1+ edema bilateraaly edema; No deformity  SKIN: Warm and dry NEUROLOGIC:  Alert and oriented x 3 PSYCHIATRIC:  Normal affect    Signed, Norman Herrlich, MD  03/31/2018 10:26 AM    [facility]

## 2018-03-31 ENCOUNTER — Ambulatory Visit (INDEPENDENT_AMBULATORY_CARE_PROVIDER_SITE_OTHER): Payer: Medicare Other | Admitting: Cardiology

## 2018-03-31 ENCOUNTER — Encounter: Payer: Self-pay | Admitting: Cardiology

## 2018-03-31 VITALS — BP 106/54 | HR 81 | Ht <= 58 in | Wt 94.0 lb

## 2018-03-31 DIAGNOSIS — M6281 Muscle weakness (generalized): Secondary | ICD-10-CM | POA: Diagnosis not present

## 2018-03-31 DIAGNOSIS — I481 Persistent atrial fibrillation: Secondary | ICD-10-CM | POA: Diagnosis not present

## 2018-03-31 DIAGNOSIS — Z7901 Long term (current) use of anticoagulants: Secondary | ICD-10-CM | POA: Diagnosis not present

## 2018-03-31 DIAGNOSIS — R05 Cough: Secondary | ICD-10-CM | POA: Diagnosis not present

## 2018-03-31 DIAGNOSIS — R2681 Unsteadiness on feet: Secondary | ICD-10-CM | POA: Diagnosis not present

## 2018-03-31 DIAGNOSIS — R488 Other symbolic dysfunctions: Secondary | ICD-10-CM | POA: Diagnosis not present

## 2018-03-31 DIAGNOSIS — K219 Gastro-esophageal reflux disease without esophagitis: Secondary | ICD-10-CM | POA: Diagnosis not present

## 2018-03-31 DIAGNOSIS — I110 Hypertensive heart disease with heart failure: Secondary | ICD-10-CM | POA: Diagnosis not present

## 2018-03-31 DIAGNOSIS — I5032 Chronic diastolic (congestive) heart failure: Secondary | ICD-10-CM

## 2018-03-31 DIAGNOSIS — R0989 Other specified symptoms and signs involving the circulatory and respiratory systems: Secondary | ICD-10-CM | POA: Diagnosis not present

## 2018-03-31 DIAGNOSIS — R1312 Dysphagia, oropharyngeal phase: Secondary | ICD-10-CM | POA: Diagnosis not present

## 2018-03-31 DIAGNOSIS — J189 Pneumonia, unspecified organism: Secondary | ICD-10-CM | POA: Diagnosis not present

## 2018-03-31 DIAGNOSIS — I4819 Other persistent atrial fibrillation: Secondary | ICD-10-CM

## 2018-03-31 DIAGNOSIS — R1319 Other dysphagia: Secondary | ICD-10-CM | POA: Diagnosis not present

## 2018-03-31 MED ORDER — FUROSEMIDE 20 MG PO TABS: 20.0000 mg | ORAL_TABLET | Freq: Two times a day (BID) | ORAL | 11 refills | Status: AC

## 2018-03-31 NOTE — Patient Instructions (Signed)
Medication Instructions:  Your physician has recommended you make the following change in your medication:  STOP amlodipine INCREASE furosemide (Lasix) to 20 mg twice daily  Labwork: None  Testing/Procedures: None  Follow-Up: Your physician wants you to follow-up in: 3 months. You will receive a reminder letter in the mail two months in advance. If you don't receive a letter, please call our office to schedule the follow-up appointment.  Any Other Special Instructions Will Be Listed Below (If Applicable).     If you need a refill on your cardiac medications before your next appointment, please call your pharmacy.

## 2018-04-01 DIAGNOSIS — R1312 Dysphagia, oropharyngeal phase: Secondary | ICD-10-CM | POA: Diagnosis not present

## 2018-04-01 DIAGNOSIS — M6281 Muscle weakness (generalized): Secondary | ICD-10-CM | POA: Diagnosis not present

## 2018-04-01 DIAGNOSIS — R2681 Unsteadiness on feet: Secondary | ICD-10-CM | POA: Diagnosis not present

## 2018-04-03 DIAGNOSIS — M6281 Muscle weakness (generalized): Secondary | ICD-10-CM | POA: Diagnosis not present

## 2018-04-03 DIAGNOSIS — R1312 Dysphagia, oropharyngeal phase: Secondary | ICD-10-CM | POA: Diagnosis not present

## 2018-04-03 DIAGNOSIS — R2681 Unsteadiness on feet: Secondary | ICD-10-CM | POA: Diagnosis not present

## 2018-04-04 DIAGNOSIS — M6281 Muscle weakness (generalized): Secondary | ICD-10-CM | POA: Diagnosis not present

## 2018-04-04 DIAGNOSIS — R2681 Unsteadiness on feet: Secondary | ICD-10-CM | POA: Diagnosis not present

## 2018-04-04 DIAGNOSIS — R1312 Dysphagia, oropharyngeal phase: Secondary | ICD-10-CM | POA: Diagnosis not present

## 2018-04-05 DIAGNOSIS — H02052 Trichiasis without entropian right lower eyelid: Secondary | ICD-10-CM | POA: Diagnosis not present

## 2018-04-05 DIAGNOSIS — R1312 Dysphagia, oropharyngeal phase: Secondary | ICD-10-CM | POA: Diagnosis not present

## 2018-04-05 DIAGNOSIS — H401132 Primary open-angle glaucoma, bilateral, moderate stage: Secondary | ICD-10-CM | POA: Diagnosis not present

## 2018-04-05 DIAGNOSIS — M6281 Muscle weakness (generalized): Secondary | ICD-10-CM | POA: Diagnosis not present

## 2018-04-05 DIAGNOSIS — R2681 Unsteadiness on feet: Secondary | ICD-10-CM | POA: Diagnosis not present

## 2018-04-06 DIAGNOSIS — G4751 Confusional arousals: Secondary | ICD-10-CM | POA: Diagnosis not present

## 2018-04-06 DIAGNOSIS — D649 Anemia, unspecified: Secondary | ICD-10-CM | POA: Diagnosis not present

## 2018-04-06 DIAGNOSIS — M6281 Muscle weakness (generalized): Secondary | ICD-10-CM | POA: Diagnosis not present

## 2018-04-06 DIAGNOSIS — R1312 Dysphagia, oropharyngeal phase: Secondary | ICD-10-CM | POA: Diagnosis not present

## 2018-04-06 DIAGNOSIS — R2681 Unsteadiness on feet: Secondary | ICD-10-CM | POA: Diagnosis not present

## 2018-04-07 DIAGNOSIS — J309 Allergic rhinitis, unspecified: Secondary | ICD-10-CM | POA: Diagnosis not present

## 2018-04-07 DIAGNOSIS — R2681 Unsteadiness on feet: Secondary | ICD-10-CM | POA: Diagnosis not present

## 2018-04-07 DIAGNOSIS — F419 Anxiety disorder, unspecified: Secondary | ICD-10-CM | POA: Diagnosis not present

## 2018-04-07 DIAGNOSIS — R1312 Dysphagia, oropharyngeal phase: Secondary | ICD-10-CM | POA: Diagnosis not present

## 2018-04-07 DIAGNOSIS — M6281 Muscle weakness (generalized): Secondary | ICD-10-CM | POA: Diagnosis not present

## 2018-04-07 DIAGNOSIS — E039 Hypothyroidism, unspecified: Secondary | ICD-10-CM | POA: Diagnosis not present

## 2018-04-07 DIAGNOSIS — F339 Major depressive disorder, recurrent, unspecified: Secondary | ICD-10-CM | POA: Diagnosis not present

## 2018-04-10 DIAGNOSIS — R2681 Unsteadiness on feet: Secondary | ICD-10-CM | POA: Diagnosis not present

## 2018-04-10 DIAGNOSIS — R1312 Dysphagia, oropharyngeal phase: Secondary | ICD-10-CM | POA: Diagnosis not present

## 2018-04-10 DIAGNOSIS — M6281 Muscle weakness (generalized): Secondary | ICD-10-CM | POA: Diagnosis not present

## 2018-04-11 ENCOUNTER — Telehealth: Payer: Self-pay | Admitting: Cardiology

## 2018-04-11 DIAGNOSIS — M6281 Muscle weakness (generalized): Secondary | ICD-10-CM | POA: Diagnosis not present

## 2018-04-11 DIAGNOSIS — R2681 Unsteadiness on feet: Secondary | ICD-10-CM | POA: Diagnosis not present

## 2018-04-11 DIAGNOSIS — R1312 Dysphagia, oropharyngeal phase: Secondary | ICD-10-CM | POA: Diagnosis not present

## 2018-04-11 NOTE — Telephone Encounter (Signed)
Patient has been feeling short of breath on and off for the past few days. Nausea and numbness in the arm also comes and goes. Advised that due to recent history, she may have fluid build up in the lining of the lung again that may need to be drained, but also that the nausea and numbness in the arm is concerning. Nurses at facility state that the patient is just anxious and her vital signs are stable. Advised to take patient to nearest emergency department to be evaluated. Rita Chapman verbalized understanding. No further questions.

## 2018-04-11 NOTE — Telephone Encounter (Signed)
Pt c/o Shortness Of Breath: STAT if SOB developed within the last 24 hours or pt is noticeably SOB on the phone  1. Are you currently SOB (can you hear that pt is SOB on the phone)? Yes and feeling numbness in arm and nausea  2. How long have you been experiencing SOB? Since last   3. Are you SOB when sitting or when up moving around? yes  4. Are you currently experiencing any other symptoms? Nausea and numbness.  She has had pneumonia and was in the ER 3 weeks ago a drew off liter of fluid. Please call daughter.

## 2018-04-12 ENCOUNTER — Telehealth: Payer: Self-pay | Admitting: Cardiology

## 2018-04-12 DIAGNOSIS — E785 Hyperlipidemia, unspecified: Secondary | ICD-10-CM | POA: Diagnosis not present

## 2018-04-12 DIAGNOSIS — R000 Tachycardia, unspecified: Secondary | ICD-10-CM | POA: Diagnosis not present

## 2018-04-12 DIAGNOSIS — I21A1 Myocardial infarction type 2: Secondary | ICD-10-CM | POA: Diagnosis not present

## 2018-04-12 DIAGNOSIS — R072 Precordial pain: Secondary | ICD-10-CM | POA: Diagnosis not present

## 2018-04-12 DIAGNOSIS — E039 Hypothyroidism, unspecified: Secondary | ICD-10-CM | POA: Diagnosis not present

## 2018-04-12 DIAGNOSIS — I499 Cardiac arrhythmia, unspecified: Secondary | ICD-10-CM | POA: Diagnosis not present

## 2018-04-12 DIAGNOSIS — R0902 Hypoxemia: Secondary | ICD-10-CM | POA: Diagnosis not present

## 2018-04-12 DIAGNOSIS — I10 Essential (primary) hypertension: Secondary | ICD-10-CM | POA: Diagnosis not present

## 2018-04-12 DIAGNOSIS — I110 Hypertensive heart disease with heart failure: Secondary | ICD-10-CM | POA: Diagnosis not present

## 2018-04-12 DIAGNOSIS — R945 Abnormal results of liver function studies: Secondary | ICD-10-CM | POA: Diagnosis not present

## 2018-04-12 DIAGNOSIS — I481 Persistent atrial fibrillation: Secondary | ICD-10-CM | POA: Diagnosis not present

## 2018-04-12 DIAGNOSIS — R0602 Shortness of breath: Secondary | ICD-10-CM | POA: Diagnosis not present

## 2018-04-12 DIAGNOSIS — I4891 Unspecified atrial fibrillation: Secondary | ICD-10-CM | POA: Diagnosis not present

## 2018-04-12 DIAGNOSIS — Z882 Allergy status to sulfonamides status: Secondary | ICD-10-CM | POA: Diagnosis not present

## 2018-04-12 DIAGNOSIS — R1310 Dysphagia, unspecified: Secondary | ICD-10-CM | POA: Diagnosis not present

## 2018-04-12 DIAGNOSIS — J300 Vasomotor rhinitis: Secondary | ICD-10-CM | POA: Diagnosis not present

## 2018-04-12 DIAGNOSIS — R9431 Abnormal electrocardiogram [ECG] [EKG]: Secondary | ICD-10-CM | POA: Diagnosis not present

## 2018-04-12 DIAGNOSIS — R079 Chest pain, unspecified: Secondary | ICD-10-CM | POA: Diagnosis not present

## 2018-04-12 DIAGNOSIS — I509 Heart failure, unspecified: Secondary | ICD-10-CM | POA: Diagnosis not present

## 2018-04-12 DIAGNOSIS — K219 Gastro-esophageal reflux disease without esophagitis: Secondary | ICD-10-CM | POA: Diagnosis not present

## 2018-04-12 MED ORDER — APIXABAN 2.5 MG PO TABS
2.50 | ORAL_TABLET | ORAL | Status: DC
Start: 2018-04-13 — End: 2018-04-12

## 2018-04-12 MED ORDER — ATORVASTATIN CALCIUM 40 MG PO TABS
20.00 | ORAL_TABLET | ORAL | Status: DC
Start: 2018-04-13 — End: 2018-04-12

## 2018-04-12 MED ORDER — BISACODYL 5 MG PO TBEC
10.00 | DELAYED_RELEASE_TABLET | ORAL | Status: DC
Start: ? — End: 2018-04-12

## 2018-04-12 MED ORDER — FUROSEMIDE 40 MG PO TABS
40.00 | ORAL_TABLET | ORAL | Status: DC
Start: 2018-04-14 — End: 2018-04-12

## 2018-04-12 MED ORDER — NITROGLYCERIN 0.4 MG SL SUBL
0.40 | SUBLINGUAL_TABLET | SUBLINGUAL | Status: DC
Start: ? — End: 2018-04-12

## 2018-04-12 MED ORDER — TIMOLOL MALEATE 0.25 % OP SOLN
1.00 | OPHTHALMIC | Status: DC
Start: 2018-04-13 — End: 2018-04-12

## 2018-04-12 MED ORDER — VITAMIN D (ERGOCALCIFEROL) 1.25 MG (50000 UNIT) PO CAPS
50000.00 | ORAL_CAPSULE | ORAL | Status: DC
Start: 2018-04-18 — End: 2018-04-12

## 2018-04-12 MED ORDER — DIAZEPAM 5 MG PO TABS
5.00 | ORAL_TABLET | ORAL | Status: DC
Start: ? — End: 2018-04-12

## 2018-04-12 MED ORDER — DOCUSATE SODIUM 100 MG PO CAPS
100.00 | ORAL_CAPSULE | ORAL | Status: DC
Start: ? — End: 2018-04-12

## 2018-04-12 MED ORDER — FLUTICASONE PROPIONATE 50 MCG/ACT NA SUSP
1.00 | NASAL | Status: DC
Start: 2018-04-14 — End: 2018-04-12

## 2018-04-12 MED ORDER — CARVEDILOL 12.5 MG PO TABS
25.00 | ORAL_TABLET | ORAL | Status: DC
Start: 2018-04-13 — End: 2018-04-12

## 2018-04-12 MED ORDER — ONDANSETRON HCL 4 MG/2ML IJ SOLN
4.00 | INTRAMUSCULAR | Status: DC
Start: ? — End: 2018-04-12

## 2018-04-12 MED ORDER — SERTRALINE HCL 25 MG PO TABS
25.00 | ORAL_TABLET | ORAL | Status: DC
Start: 2018-04-14 — End: 2018-04-12

## 2018-04-12 MED ORDER — LORATADINE 10 MG PO TABS
10.00 | ORAL_TABLET | ORAL | Status: DC
Start: 2018-04-14 — End: 2018-04-12

## 2018-04-12 MED ORDER — LOSARTAN POTASSIUM 25 MG PO TABS
25.00 | ORAL_TABLET | ORAL | Status: DC
Start: 2018-04-14 — End: 2018-04-12

## 2018-04-12 MED ORDER — PANTOPRAZOLE SODIUM 40 MG PO TBEC
40.00 | DELAYED_RELEASE_TABLET | ORAL | Status: DC
Start: 2018-04-14 — End: 2018-04-12

## 2018-04-12 MED ORDER — LEVOTHYROXINE SODIUM 25 MCG PO TABS
25.00 | ORAL_TABLET | ORAL | Status: DC
Start: 2018-04-14 — End: 2018-04-12

## 2018-04-12 MED ORDER — GENERIC EXTERNAL MEDICATION
5.00 | Status: DC
Start: ? — End: 2018-04-12

## 2018-04-12 NOTE — Telephone Encounter (Signed)
Advised daughter, Meriam Sprague, that Dr. Dulce Sellar made aware that patient is in the hospital. Advised that whatever medication changes need to be made in the hospital is fine. Advised to contact our office once patient is discharged to set up follow-up appointment with Dr. Dulce Sellar then. Beverly verbalized understanding. No further questions.

## 2018-04-12 NOTE — Telephone Encounter (Signed)
Pt is in [facility] and she just knows they are going to mess with her medicine

## 2018-04-13 ENCOUNTER — Telehealth: Payer: Self-pay | Admitting: Cardiology

## 2018-04-13 DIAGNOSIS — R000 Tachycardia, unspecified: Secondary | ICD-10-CM | POA: Diagnosis not present

## 2018-04-13 DIAGNOSIS — M1990 Unspecified osteoarthritis, unspecified site: Secondary | ICD-10-CM | POA: Diagnosis present

## 2018-04-13 DIAGNOSIS — E039 Hypothyroidism, unspecified: Secondary | ICD-10-CM | POA: Diagnosis not present

## 2018-04-13 DIAGNOSIS — I481 Persistent atrial fibrillation: Secondary | ICD-10-CM | POA: Diagnosis not present

## 2018-04-13 DIAGNOSIS — M40209 Unspecified kyphosis, site unspecified: Secondary | ICD-10-CM | POA: Diagnosis present

## 2018-04-13 DIAGNOSIS — F17211 Nicotine dependence, cigarettes, in remission: Secondary | ICD-10-CM | POA: Diagnosis not present

## 2018-04-13 DIAGNOSIS — E785 Hyperlipidemia, unspecified: Secondary | ICD-10-CM | POA: Diagnosis not present

## 2018-04-13 DIAGNOSIS — I10 Essential (primary) hypertension: Secondary | ICD-10-CM | POA: Diagnosis not present

## 2018-04-13 DIAGNOSIS — Z7401 Bed confinement status: Secondary | ICD-10-CM | POA: Diagnosis not present

## 2018-04-13 DIAGNOSIS — M2550 Pain in unspecified joint: Secondary | ICD-10-CM | POA: Diagnosis not present

## 2018-04-13 DIAGNOSIS — M810 Age-related osteoporosis without current pathological fracture: Secondary | ICD-10-CM | POA: Diagnosis present

## 2018-04-13 DIAGNOSIS — R1310 Dysphagia, unspecified: Secondary | ICD-10-CM | POA: Diagnosis present

## 2018-04-13 DIAGNOSIS — Z79899 Other long term (current) drug therapy: Secondary | ICD-10-CM | POA: Diagnosis not present

## 2018-04-13 DIAGNOSIS — Z87891 Personal history of nicotine dependence: Secondary | ICD-10-CM | POA: Diagnosis not present

## 2018-04-13 DIAGNOSIS — Z7901 Long term (current) use of anticoagulants: Secondary | ICD-10-CM | POA: Diagnosis not present

## 2018-04-13 DIAGNOSIS — I509 Heart failure, unspecified: Secondary | ICD-10-CM | POA: Diagnosis present

## 2018-04-13 DIAGNOSIS — I21A1 Myocardial infarction type 2: Secondary | ICD-10-CM | POA: Diagnosis present

## 2018-04-13 DIAGNOSIS — Z882 Allergy status to sulfonamides status: Secondary | ICD-10-CM | POA: Diagnosis not present

## 2018-04-13 DIAGNOSIS — K219 Gastro-esophageal reflux disease without esophagitis: Secondary | ICD-10-CM | POA: Diagnosis not present

## 2018-04-13 DIAGNOSIS — E7800 Pure hypercholesterolemia, unspecified: Secondary | ICD-10-CM | POA: Diagnosis not present

## 2018-04-13 DIAGNOSIS — R945 Abnormal results of liver function studies: Secondary | ICD-10-CM | POA: Diagnosis not present

## 2018-04-13 DIAGNOSIS — I4891 Unspecified atrial fibrillation: Secondary | ICD-10-CM | POA: Diagnosis not present

## 2018-04-13 DIAGNOSIS — Z79890 Hormone replacement therapy (postmenopausal): Secondary | ICD-10-CM | POA: Diagnosis not present

## 2018-04-13 DIAGNOSIS — I110 Hypertensive heart disease with heart failure: Secondary | ICD-10-CM | POA: Diagnosis present

## 2018-04-13 NOTE — Telephone Encounter (Signed)
Daughter, Meriam Sprague, called again this morning upset that the patient has not seen a cardiologist at [facility]. Meriam Sprague also upset because they want to draw the fluid off the patients lung, but the patient told them no because one of the risks is that they could puncture a lung. Meriam Sprague wants Dr. Dulce Sellar to transfer the patient to [facility]. Claudette Head that Dr. Dulce Sellar does not have privileges at [facility] to initiate a transfer, but Meriam Sprague could request the patient be transferred. Did advise Meriam Sprague that it would depend on if [facility] had any available beds and that a physician would have to accept care. Claudette Head that she could also request a cardiology consult. Meriam Sprague was upset that the physician tried to "push on mom having the fluid drawn off her lung." Advised Meriam Sprague that it is the patients decision and the patient can say no, but that she would likely not feel better, would still be short of breath, and that the shortness of breath may get worse as the fluid may build up. Meriam Sprague verbalized understanding of all information. Meriam Sprague will talk to the patient when she gets to the hospital this morning and discuss what the patient truly wants. No further questions.

## 2018-04-13 NOTE — Telephone Encounter (Signed)
Patient's daughter states her mom is in [facility] and wants advice on moving her to [facility].

## 2018-04-17 DIAGNOSIS — I481 Persistent atrial fibrillation: Secondary | ICD-10-CM | POA: Diagnosis not present

## 2018-04-17 DIAGNOSIS — I5032 Chronic diastolic (congestive) heart failure: Secondary | ICD-10-CM | POA: Diagnosis not present

## 2018-04-17 DIAGNOSIS — K219 Gastro-esophageal reflux disease without esophagitis: Secondary | ICD-10-CM | POA: Diagnosis not present

## 2018-04-17 DIAGNOSIS — I110 Hypertensive heart disease with heart failure: Secondary | ICD-10-CM | POA: Diagnosis not present

## 2018-04-18 DIAGNOSIS — E039 Hypothyroidism, unspecified: Secondary | ICD-10-CM | POA: Diagnosis not present

## 2018-04-18 DIAGNOSIS — I10 Essential (primary) hypertension: Secondary | ICD-10-CM | POA: Diagnosis not present

## 2018-04-18 DIAGNOSIS — D649 Anemia, unspecified: Secondary | ICD-10-CM | POA: Diagnosis not present

## 2018-04-18 DIAGNOSIS — I5032 Chronic diastolic (congestive) heart failure: Secondary | ICD-10-CM | POA: Diagnosis not present

## 2018-04-18 DIAGNOSIS — E785 Hyperlipidemia, unspecified: Secondary | ICD-10-CM | POA: Diagnosis not present

## 2018-05-12 DIAGNOSIS — I10 Essential (primary) hypertension: Secondary | ICD-10-CM | POA: Diagnosis not present

## 2018-05-12 DIAGNOSIS — D649 Anemia, unspecified: Secondary | ICD-10-CM | POA: Diagnosis not present

## 2018-05-15 DIAGNOSIS — R319 Hematuria, unspecified: Secondary | ICD-10-CM | POA: Diagnosis not present

## 2018-05-15 DIAGNOSIS — N390 Urinary tract infection, site not specified: Secondary | ICD-10-CM | POA: Diagnosis not present

## 2018-05-22 DIAGNOSIS — R05 Cough: Secondary | ICD-10-CM | POA: Diagnosis not present

## 2018-05-26 DIAGNOSIS — I5033 Acute on chronic diastolic (congestive) heart failure: Secondary | ICD-10-CM | POA: Diagnosis not present

## 2018-05-26 DIAGNOSIS — D649 Anemia, unspecified: Secondary | ICD-10-CM | POA: Diagnosis not present

## 2018-05-31 DIAGNOSIS — I4891 Unspecified atrial fibrillation: Secondary | ICD-10-CM | POA: Diagnosis not present

## 2018-05-31 DIAGNOSIS — J80 Acute respiratory distress syndrome: Secondary | ICD-10-CM | POA: Diagnosis not present

## 2018-05-31 DIAGNOSIS — R071 Chest pain on breathing: Secondary | ICD-10-CM | POA: Diagnosis not present

## 2018-05-31 DIAGNOSIS — R0789 Other chest pain: Secondary | ICD-10-CM | POA: Diagnosis not present

## 2018-05-31 DIAGNOSIS — R079 Chest pain, unspecified: Secondary | ICD-10-CM | POA: Diagnosis not present

## 2018-06-02 DIAGNOSIS — Z8719 Personal history of other diseases of the digestive system: Secondary | ICD-10-CM | POA: Diagnosis not present

## 2018-06-02 DIAGNOSIS — I5043 Acute on chronic combined systolic (congestive) and diastolic (congestive) heart failure: Secondary | ICD-10-CM | POA: Diagnosis not present

## 2018-06-02 DIAGNOSIS — K219 Gastro-esophageal reflux disease without esophagitis: Secondary | ICD-10-CM | POA: Diagnosis not present

## 2018-06-02 DIAGNOSIS — Z8679 Personal history of other diseases of the circulatory system: Secondary | ICD-10-CM | POA: Diagnosis not present

## 2018-06-02 DIAGNOSIS — Z8739 Personal history of other diseases of the musculoskeletal system and connective tissue: Secondary | ICD-10-CM | POA: Diagnosis not present

## 2018-06-02 DIAGNOSIS — Z8639 Personal history of other endocrine, nutritional and metabolic disease: Secondary | ICD-10-CM | POA: Diagnosis not present

## 2018-06-02 DIAGNOSIS — J449 Chronic obstructive pulmonary disease, unspecified: Secondary | ICD-10-CM | POA: Diagnosis not present

## 2018-06-02 DIAGNOSIS — I5030 Unspecified diastolic (congestive) heart failure: Secondary | ICD-10-CM | POA: Diagnosis not present

## 2018-06-02 DIAGNOSIS — I4891 Unspecified atrial fibrillation: Secondary | ICD-10-CM | POA: Diagnosis not present

## 2018-06-02 DIAGNOSIS — Z8669 Personal history of other diseases of the nervous system and sense organs: Secondary | ICD-10-CM | POA: Diagnosis not present

## 2018-06-02 DIAGNOSIS — F329 Major depressive disorder, single episode, unspecified: Secondary | ICD-10-CM | POA: Diagnosis not present

## 2018-06-02 DIAGNOSIS — Z66 Do not resuscitate: Secondary | ICD-10-CM | POA: Diagnosis not present

## 2018-06-02 DIAGNOSIS — Z7401 Bed confinement status: Secondary | ICD-10-CM | POA: Diagnosis not present

## 2018-06-02 DIAGNOSIS — M2550 Pain in unspecified joint: Secondary | ICD-10-CM | POA: Diagnosis not present

## 2018-06-02 DIAGNOSIS — J9601 Acute respiratory failure with hypoxia: Secondary | ICD-10-CM | POA: Diagnosis not present

## 2018-06-02 DIAGNOSIS — Z8659 Personal history of other mental and behavioral disorders: Secondary | ICD-10-CM | POA: Diagnosis not present

## 2018-06-06 DIAGNOSIS — I5030 Unspecified diastolic (congestive) heart failure: Secondary | ICD-10-CM | POA: Diagnosis not present

## 2018-06-06 DIAGNOSIS — Z8679 Personal history of other diseases of the circulatory system: Secondary | ICD-10-CM | POA: Diagnosis not present

## 2018-06-06 DIAGNOSIS — J449 Chronic obstructive pulmonary disease, unspecified: Secondary | ICD-10-CM | POA: Diagnosis not present

## 2018-06-06 DIAGNOSIS — I4891 Unspecified atrial fibrillation: Secondary | ICD-10-CM | POA: Diagnosis not present

## 2018-06-06 DIAGNOSIS — Z8639 Personal history of other endocrine, nutritional and metabolic disease: Secondary | ICD-10-CM | POA: Diagnosis not present

## 2018-06-06 DIAGNOSIS — Z8719 Personal history of other diseases of the digestive system: Secondary | ICD-10-CM | POA: Diagnosis not present

## 2018-06-07 DIAGNOSIS — Z8719 Personal history of other diseases of the digestive system: Secondary | ICD-10-CM | POA: Diagnosis not present

## 2018-06-07 DIAGNOSIS — J449 Chronic obstructive pulmonary disease, unspecified: Secondary | ICD-10-CM | POA: Diagnosis not present

## 2018-06-07 DIAGNOSIS — I4891 Unspecified atrial fibrillation: Secondary | ICD-10-CM | POA: Diagnosis not present

## 2018-06-07 DIAGNOSIS — Z8679 Personal history of other diseases of the circulatory system: Secondary | ICD-10-CM | POA: Diagnosis not present

## 2018-06-07 DIAGNOSIS — Z8639 Personal history of other endocrine, nutritional and metabolic disease: Secondary | ICD-10-CM | POA: Diagnosis not present

## 2018-06-07 DIAGNOSIS — I5030 Unspecified diastolic (congestive) heart failure: Secondary | ICD-10-CM | POA: Diagnosis not present

## 2018-06-08 DIAGNOSIS — Z8679 Personal history of other diseases of the circulatory system: Secondary | ICD-10-CM | POA: Diagnosis not present

## 2018-06-08 DIAGNOSIS — I5033 Acute on chronic diastolic (congestive) heart failure: Secondary | ICD-10-CM | POA: Diagnosis not present

## 2018-06-08 DIAGNOSIS — I5030 Unspecified diastolic (congestive) heart failure: Secondary | ICD-10-CM | POA: Diagnosis not present

## 2018-06-08 DIAGNOSIS — I4891 Unspecified atrial fibrillation: Secondary | ICD-10-CM | POA: Diagnosis not present

## 2018-06-08 DIAGNOSIS — Z8719 Personal history of other diseases of the digestive system: Secondary | ICD-10-CM | POA: Diagnosis not present

## 2018-06-08 DIAGNOSIS — Z8639 Personal history of other endocrine, nutritional and metabolic disease: Secondary | ICD-10-CM | POA: Diagnosis not present

## 2018-06-08 DIAGNOSIS — J449 Chronic obstructive pulmonary disease, unspecified: Secondary | ICD-10-CM | POA: Diagnosis not present

## 2018-06-08 DIAGNOSIS — I481 Persistent atrial fibrillation: Secondary | ICD-10-CM | POA: Diagnosis not present

## 2018-06-08 DIAGNOSIS — J9610 Chronic respiratory failure, unspecified whether with hypoxia or hypercapnia: Secondary | ICD-10-CM | POA: Diagnosis not present

## 2018-06-09 DIAGNOSIS — I5030 Unspecified diastolic (congestive) heart failure: Secondary | ICD-10-CM | POA: Diagnosis not present

## 2018-06-09 DIAGNOSIS — Z8719 Personal history of other diseases of the digestive system: Secondary | ICD-10-CM | POA: Diagnosis not present

## 2018-06-09 DIAGNOSIS — I4891 Unspecified atrial fibrillation: Secondary | ICD-10-CM | POA: Diagnosis not present

## 2018-06-09 DIAGNOSIS — J449 Chronic obstructive pulmonary disease, unspecified: Secondary | ICD-10-CM | POA: Diagnosis not present

## 2018-06-09 DIAGNOSIS — Z8639 Personal history of other endocrine, nutritional and metabolic disease: Secondary | ICD-10-CM | POA: Diagnosis not present

## 2018-06-09 DIAGNOSIS — Z8679 Personal history of other diseases of the circulatory system: Secondary | ICD-10-CM | POA: Diagnosis not present

## 2018-06-12 DIAGNOSIS — I5030 Unspecified diastolic (congestive) heart failure: Secondary | ICD-10-CM | POA: Diagnosis not present

## 2018-06-12 DIAGNOSIS — I4891 Unspecified atrial fibrillation: Secondary | ICD-10-CM | POA: Diagnosis not present

## 2018-06-12 DIAGNOSIS — Z8639 Personal history of other endocrine, nutritional and metabolic disease: Secondary | ICD-10-CM | POA: Diagnosis not present

## 2018-06-12 DIAGNOSIS — Z8679 Personal history of other diseases of the circulatory system: Secondary | ICD-10-CM | POA: Diagnosis not present

## 2018-06-12 DIAGNOSIS — Z8719 Personal history of other diseases of the digestive system: Secondary | ICD-10-CM | POA: Diagnosis not present

## 2018-06-12 DIAGNOSIS — J449 Chronic obstructive pulmonary disease, unspecified: Secondary | ICD-10-CM | POA: Diagnosis not present

## 2018-06-15 DIAGNOSIS — J449 Chronic obstructive pulmonary disease, unspecified: Secondary | ICD-10-CM | POA: Diagnosis not present

## 2018-06-15 DIAGNOSIS — Z8659 Personal history of other mental and behavioral disorders: Secondary | ICD-10-CM | POA: Diagnosis not present

## 2018-06-15 DIAGNOSIS — Z8739 Personal history of other diseases of the musculoskeletal system and connective tissue: Secondary | ICD-10-CM | POA: Diagnosis not present

## 2018-06-15 DIAGNOSIS — I5030 Unspecified diastolic (congestive) heart failure: Secondary | ICD-10-CM | POA: Diagnosis not present

## 2018-06-15 DIAGNOSIS — Z8669 Personal history of other diseases of the nervous system and sense organs: Secondary | ICD-10-CM | POA: Diagnosis not present

## 2018-06-15 DIAGNOSIS — Z8719 Personal history of other diseases of the digestive system: Secondary | ICD-10-CM | POA: Diagnosis not present

## 2018-06-15 DIAGNOSIS — Z8639 Personal history of other endocrine, nutritional and metabolic disease: Secondary | ICD-10-CM | POA: Diagnosis not present

## 2018-06-15 DIAGNOSIS — I4891 Unspecified atrial fibrillation: Secondary | ICD-10-CM | POA: Diagnosis not present

## 2018-06-15 DIAGNOSIS — Z8679 Personal history of other diseases of the circulatory system: Secondary | ICD-10-CM | POA: Diagnosis not present

## 2018-06-16 DIAGNOSIS — J449 Chronic obstructive pulmonary disease, unspecified: Secondary | ICD-10-CM | POA: Diagnosis not present

## 2018-06-16 DIAGNOSIS — Z8639 Personal history of other endocrine, nutritional and metabolic disease: Secondary | ICD-10-CM | POA: Diagnosis not present

## 2018-06-16 DIAGNOSIS — Z8679 Personal history of other diseases of the circulatory system: Secondary | ICD-10-CM | POA: Diagnosis not present

## 2018-06-16 DIAGNOSIS — I5030 Unspecified diastolic (congestive) heart failure: Secondary | ICD-10-CM | POA: Diagnosis not present

## 2018-06-16 DIAGNOSIS — Z8719 Personal history of other diseases of the digestive system: Secondary | ICD-10-CM | POA: Diagnosis not present

## 2018-06-16 DIAGNOSIS — I4891 Unspecified atrial fibrillation: Secondary | ICD-10-CM | POA: Diagnosis not present

## 2018-06-19 DIAGNOSIS — J449 Chronic obstructive pulmonary disease, unspecified: Secondary | ICD-10-CM | POA: Diagnosis not present

## 2018-06-19 DIAGNOSIS — I4891 Unspecified atrial fibrillation: Secondary | ICD-10-CM | POA: Diagnosis not present

## 2018-06-19 DIAGNOSIS — I5030 Unspecified diastolic (congestive) heart failure: Secondary | ICD-10-CM | POA: Diagnosis not present

## 2018-06-19 DIAGNOSIS — Z8639 Personal history of other endocrine, nutritional and metabolic disease: Secondary | ICD-10-CM | POA: Diagnosis not present

## 2018-06-19 DIAGNOSIS — Z8719 Personal history of other diseases of the digestive system: Secondary | ICD-10-CM | POA: Diagnosis not present

## 2018-06-19 DIAGNOSIS — Z8679 Personal history of other diseases of the circulatory system: Secondary | ICD-10-CM | POA: Diagnosis not present

## 2018-06-20 DIAGNOSIS — J449 Chronic obstructive pulmonary disease, unspecified: Secondary | ICD-10-CM | POA: Diagnosis not present

## 2018-06-20 DIAGNOSIS — Z8679 Personal history of other diseases of the circulatory system: Secondary | ICD-10-CM | POA: Diagnosis not present

## 2018-06-20 DIAGNOSIS — Z8639 Personal history of other endocrine, nutritional and metabolic disease: Secondary | ICD-10-CM | POA: Diagnosis not present

## 2018-06-20 DIAGNOSIS — I4891 Unspecified atrial fibrillation: Secondary | ICD-10-CM | POA: Diagnosis not present

## 2018-06-20 DIAGNOSIS — I5030 Unspecified diastolic (congestive) heart failure: Secondary | ICD-10-CM | POA: Diagnosis not present

## 2018-06-20 DIAGNOSIS — Z8719 Personal history of other diseases of the digestive system: Secondary | ICD-10-CM | POA: Diagnosis not present

## 2018-06-21 DIAGNOSIS — Z8719 Personal history of other diseases of the digestive system: Secondary | ICD-10-CM | POA: Diagnosis not present

## 2018-06-21 DIAGNOSIS — I4891 Unspecified atrial fibrillation: Secondary | ICD-10-CM | POA: Diagnosis not present

## 2018-06-21 DIAGNOSIS — J449 Chronic obstructive pulmonary disease, unspecified: Secondary | ICD-10-CM | POA: Diagnosis not present

## 2018-06-21 DIAGNOSIS — Z8679 Personal history of other diseases of the circulatory system: Secondary | ICD-10-CM | POA: Diagnosis not present

## 2018-06-21 DIAGNOSIS — Z8639 Personal history of other endocrine, nutritional and metabolic disease: Secondary | ICD-10-CM | POA: Diagnosis not present

## 2018-06-21 DIAGNOSIS — I5030 Unspecified diastolic (congestive) heart failure: Secondary | ICD-10-CM | POA: Diagnosis not present

## 2018-06-23 DIAGNOSIS — I5030 Unspecified diastolic (congestive) heart failure: Secondary | ICD-10-CM | POA: Diagnosis not present

## 2018-06-23 DIAGNOSIS — I4891 Unspecified atrial fibrillation: Secondary | ICD-10-CM | POA: Diagnosis not present

## 2018-06-23 DIAGNOSIS — J449 Chronic obstructive pulmonary disease, unspecified: Secondary | ICD-10-CM | POA: Diagnosis not present

## 2018-06-23 DIAGNOSIS — Z8639 Personal history of other endocrine, nutritional and metabolic disease: Secondary | ICD-10-CM | POA: Diagnosis not present

## 2018-06-23 DIAGNOSIS — Z8719 Personal history of other diseases of the digestive system: Secondary | ICD-10-CM | POA: Diagnosis not present

## 2018-06-23 DIAGNOSIS — Z8679 Personal history of other diseases of the circulatory system: Secondary | ICD-10-CM | POA: Diagnosis not present

## 2018-06-24 DIAGNOSIS — J449 Chronic obstructive pulmonary disease, unspecified: Secondary | ICD-10-CM | POA: Diagnosis not present

## 2018-06-24 DIAGNOSIS — I4891 Unspecified atrial fibrillation: Secondary | ICD-10-CM | POA: Diagnosis not present

## 2018-06-24 DIAGNOSIS — Z8639 Personal history of other endocrine, nutritional and metabolic disease: Secondary | ICD-10-CM | POA: Diagnosis not present

## 2018-06-24 DIAGNOSIS — I5030 Unspecified diastolic (congestive) heart failure: Secondary | ICD-10-CM | POA: Diagnosis not present

## 2018-06-24 DIAGNOSIS — Z8719 Personal history of other diseases of the digestive system: Secondary | ICD-10-CM | POA: Diagnosis not present

## 2018-06-24 DIAGNOSIS — Z8679 Personal history of other diseases of the circulatory system: Secondary | ICD-10-CM | POA: Diagnosis not present

## 2018-06-25 DIAGNOSIS — Z8679 Personal history of other diseases of the circulatory system: Secondary | ICD-10-CM | POA: Diagnosis not present

## 2018-06-25 DIAGNOSIS — I4891 Unspecified atrial fibrillation: Secondary | ICD-10-CM | POA: Diagnosis not present

## 2018-06-25 DIAGNOSIS — Z8719 Personal history of other diseases of the digestive system: Secondary | ICD-10-CM | POA: Diagnosis not present

## 2018-06-25 DIAGNOSIS — J449 Chronic obstructive pulmonary disease, unspecified: Secondary | ICD-10-CM | POA: Diagnosis not present

## 2018-06-25 DIAGNOSIS — I5030 Unspecified diastolic (congestive) heart failure: Secondary | ICD-10-CM | POA: Diagnosis not present

## 2018-06-25 DIAGNOSIS — Z8639 Personal history of other endocrine, nutritional and metabolic disease: Secondary | ICD-10-CM | POA: Diagnosis not present

## 2018-06-26 DIAGNOSIS — I5030 Unspecified diastolic (congestive) heart failure: Secondary | ICD-10-CM | POA: Diagnosis not present

## 2018-06-26 DIAGNOSIS — J449 Chronic obstructive pulmonary disease, unspecified: Secondary | ICD-10-CM | POA: Diagnosis not present

## 2018-06-26 DIAGNOSIS — I4891 Unspecified atrial fibrillation: Secondary | ICD-10-CM | POA: Diagnosis not present

## 2018-06-26 DIAGNOSIS — Z8719 Personal history of other diseases of the digestive system: Secondary | ICD-10-CM | POA: Diagnosis not present

## 2018-06-26 DIAGNOSIS — Z8639 Personal history of other endocrine, nutritional and metabolic disease: Secondary | ICD-10-CM | POA: Diagnosis not present

## 2018-06-26 DIAGNOSIS — Z8679 Personal history of other diseases of the circulatory system: Secondary | ICD-10-CM | POA: Diagnosis not present

## 2018-06-27 DIAGNOSIS — Z8679 Personal history of other diseases of the circulatory system: Secondary | ICD-10-CM | POA: Diagnosis not present

## 2018-06-27 DIAGNOSIS — Z8719 Personal history of other diseases of the digestive system: Secondary | ICD-10-CM | POA: Diagnosis not present

## 2018-06-27 DIAGNOSIS — J449 Chronic obstructive pulmonary disease, unspecified: Secondary | ICD-10-CM | POA: Diagnosis not present

## 2018-06-27 DIAGNOSIS — I5030 Unspecified diastolic (congestive) heart failure: Secondary | ICD-10-CM | POA: Diagnosis not present

## 2018-06-27 DIAGNOSIS — Z8639 Personal history of other endocrine, nutritional and metabolic disease: Secondary | ICD-10-CM | POA: Diagnosis not present

## 2018-06-27 DIAGNOSIS — I4891 Unspecified atrial fibrillation: Secondary | ICD-10-CM | POA: Diagnosis not present

## 2018-06-28 DIAGNOSIS — Z8639 Personal history of other endocrine, nutritional and metabolic disease: Secondary | ICD-10-CM | POA: Diagnosis not present

## 2018-06-28 DIAGNOSIS — J449 Chronic obstructive pulmonary disease, unspecified: Secondary | ICD-10-CM | POA: Diagnosis not present

## 2018-06-28 DIAGNOSIS — I5030 Unspecified diastolic (congestive) heart failure: Secondary | ICD-10-CM | POA: Diagnosis not present

## 2018-06-28 DIAGNOSIS — Z8719 Personal history of other diseases of the digestive system: Secondary | ICD-10-CM | POA: Diagnosis not present

## 2018-06-28 DIAGNOSIS — Z8679 Personal history of other diseases of the circulatory system: Secondary | ICD-10-CM | POA: Diagnosis not present

## 2018-06-28 DIAGNOSIS — I4891 Unspecified atrial fibrillation: Secondary | ICD-10-CM | POA: Diagnosis not present

## 2018-06-30 DIAGNOSIS — Z8719 Personal history of other diseases of the digestive system: Secondary | ICD-10-CM | POA: Diagnosis not present

## 2018-06-30 DIAGNOSIS — I4891 Unspecified atrial fibrillation: Secondary | ICD-10-CM | POA: Diagnosis not present

## 2018-06-30 DIAGNOSIS — J449 Chronic obstructive pulmonary disease, unspecified: Secondary | ICD-10-CM | POA: Diagnosis not present

## 2018-06-30 DIAGNOSIS — Z8639 Personal history of other endocrine, nutritional and metabolic disease: Secondary | ICD-10-CM | POA: Diagnosis not present

## 2018-06-30 DIAGNOSIS — Z8679 Personal history of other diseases of the circulatory system: Secondary | ICD-10-CM | POA: Diagnosis not present

## 2018-06-30 DIAGNOSIS — I5030 Unspecified diastolic (congestive) heart failure: Secondary | ICD-10-CM | POA: Diagnosis not present

## 2018-07-16 DEATH — deceased

## 2019-02-02 IMAGING — CR DG CHEST 2V
3 series · 3 of 3 positions shown · non-contrast
Comparison: Chest x-ray dated February 13, 2018.

CLINICAL DATA: Cough and congestion with shortness of breath.

EXAM:
CHEST - 2 VIEW

[w chest lat]
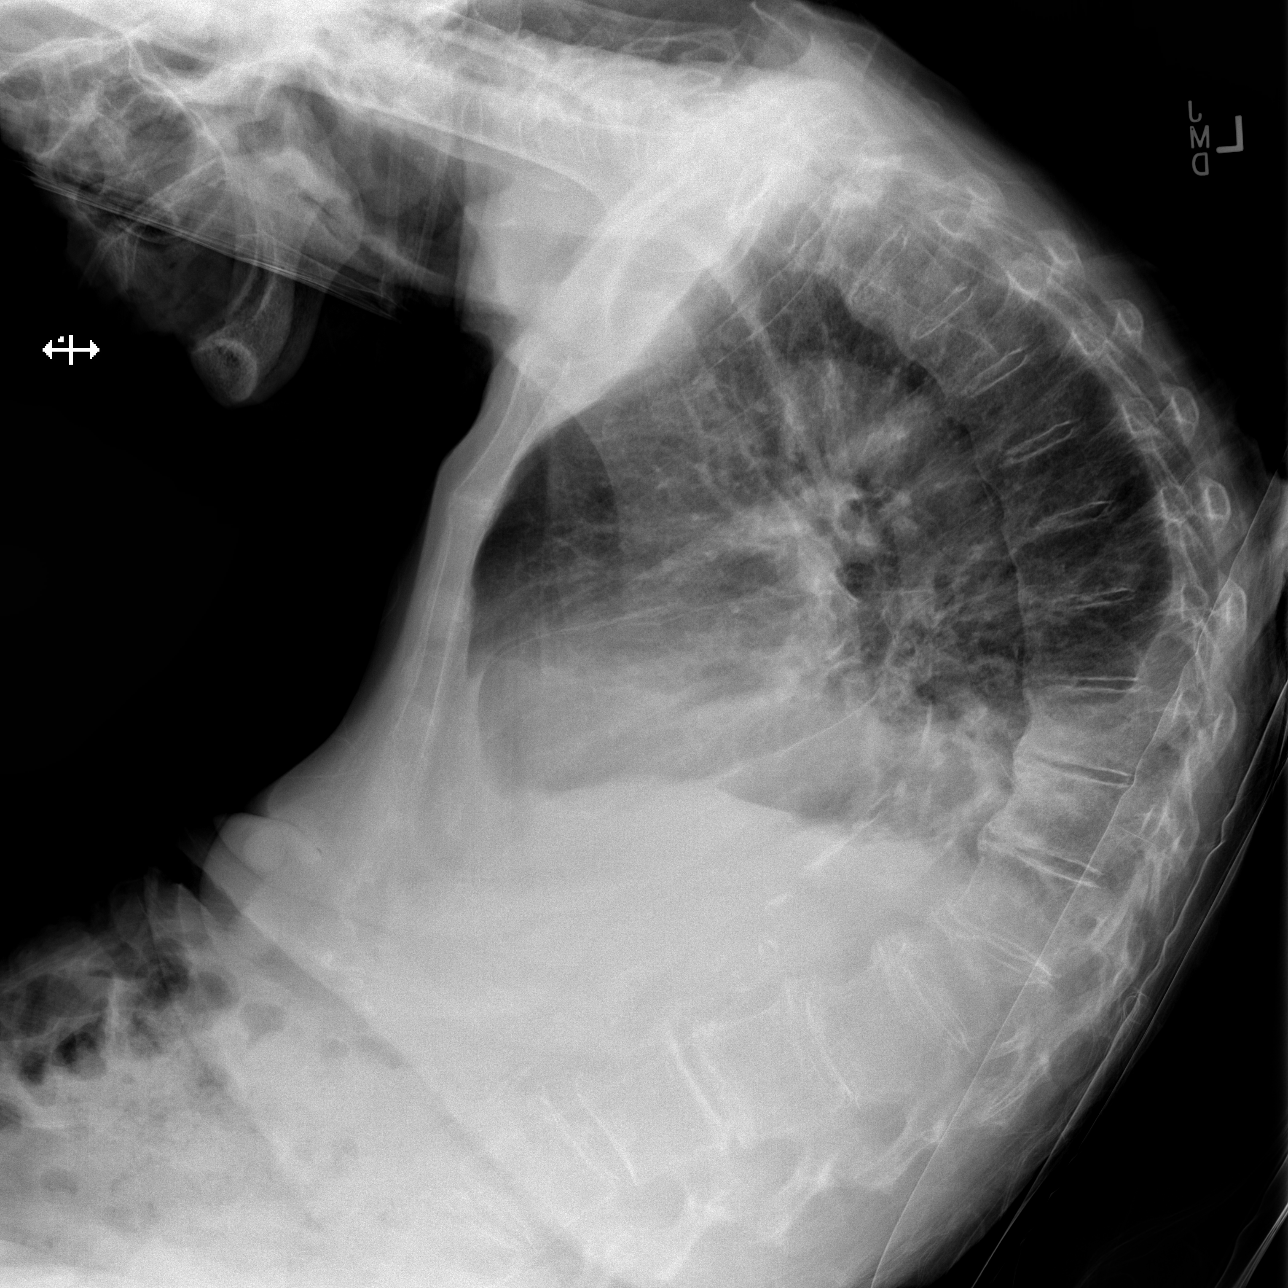

[x chest ap (1 of 2)]
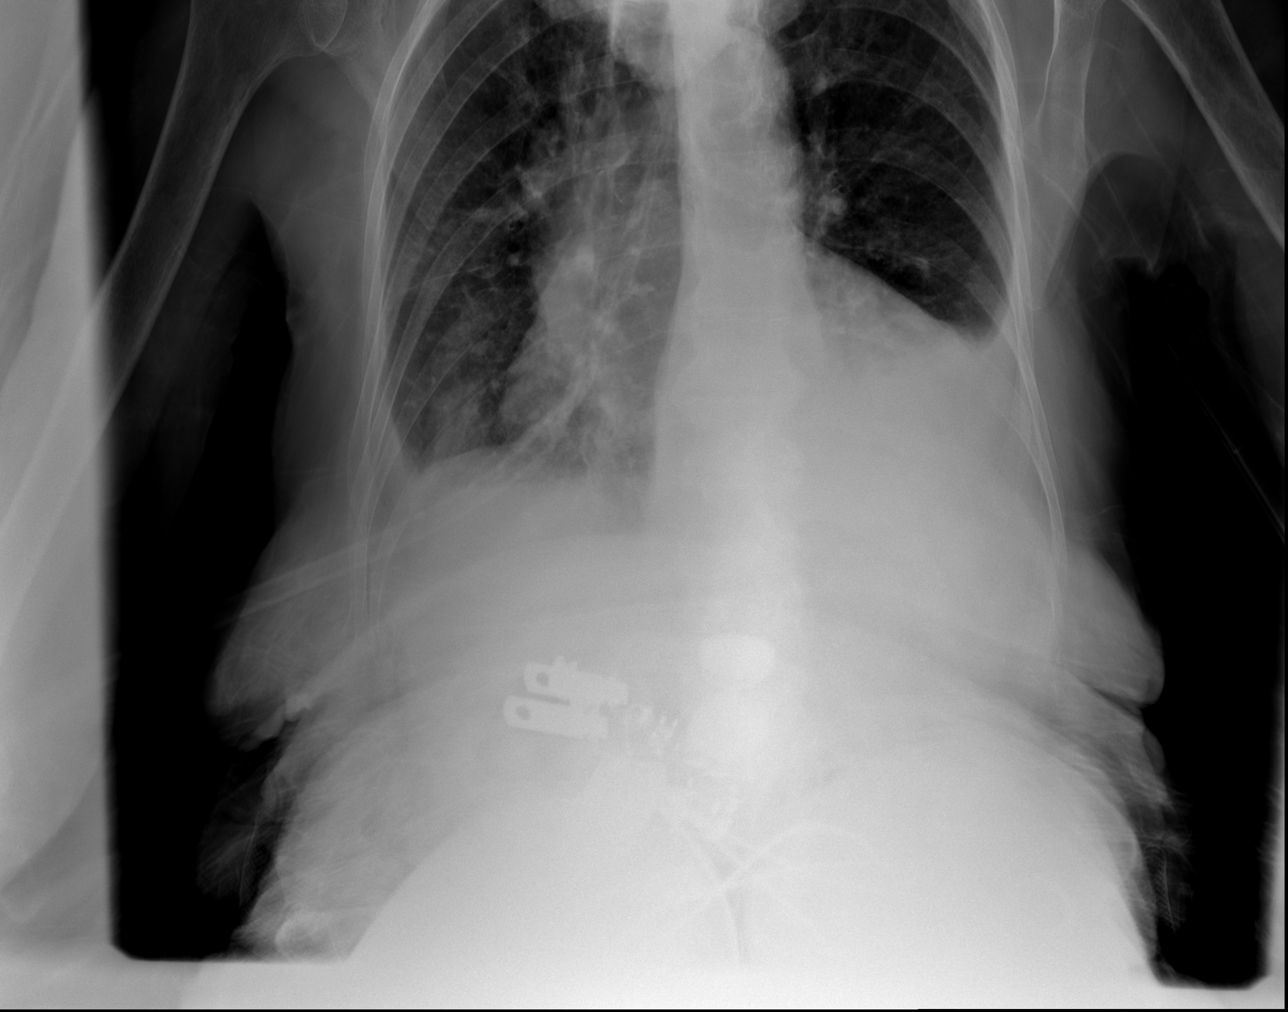

[x chest ap (2 of 2)]
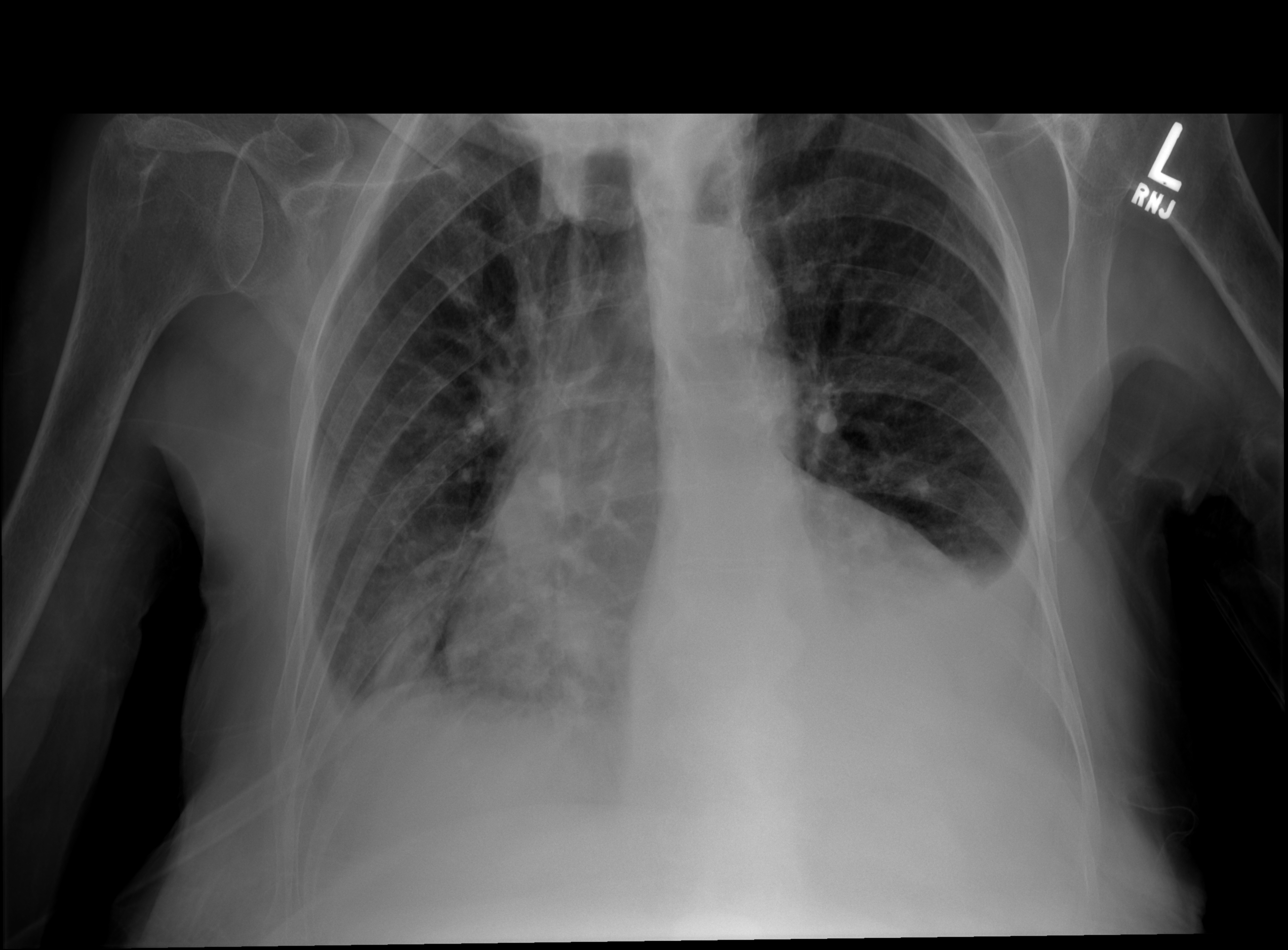

[3 of 3 positions shown; findings below may reference images not displayed]

FINDINGS: Mild cardiomegaly. Normal pulmonary vascularity. Small left and
trace right pleural effusions. Left lower lobe atelectasis. No
consolidation or pneumothorax. No acute osseous abnormality.
Unchanged mild compression deformity of T12.
IMPRESSION: Small left and trace right pleural effusions.
